# Patient Record
Sex: Female | Born: 1970
Health system: Southern US, Community
[De-identification: ages and names within clinical notes are randomized; demographics above are authoritative.]

## PROBLEM LIST (undated history)

## (undated) DIAGNOSIS — F988 Other specified behavioral and emotional disorders with onset usually occurring in childhood and adolescence: Secondary | ICD-10-CM

## (undated) DIAGNOSIS — R0600 Dyspnea, unspecified: Secondary | ICD-10-CM

## (undated) DIAGNOSIS — F32A Depression, unspecified: Secondary | ICD-10-CM

## (undated) DIAGNOSIS — M79673 Pain in unspecified foot: Secondary | ICD-10-CM

## (undated) DIAGNOSIS — E559 Vitamin D deficiency, unspecified: Secondary | ICD-10-CM

## (undated) DIAGNOSIS — M79606 Pain in leg, unspecified: Secondary | ICD-10-CM

## (undated) DIAGNOSIS — M549 Dorsalgia, unspecified: Secondary | ICD-10-CM

## (undated) DIAGNOSIS — F329 Major depressive disorder, single episode, unspecified: Secondary | ICD-10-CM

## (undated) DIAGNOSIS — R600 Localized edema: Secondary | ICD-10-CM

## (undated) DIAGNOSIS — M2550 Pain in unspecified joint: Secondary | ICD-10-CM

## (undated) HISTORY — PX: NOVASURE ABLATION: SHX5394

## (undated) HISTORY — PX: OTHER SURGICAL HISTORY: SHX169

## (undated) HISTORY — DX: Vitamin D deficiency, unspecified: E55.9

## (undated) HISTORY — DX: Dorsalgia, unspecified: M54.9

## (undated) HISTORY — DX: Pain in leg, unspecified: M79.606

## (undated) HISTORY — DX: Pain in unspecified foot: M79.673

## (undated) HISTORY — DX: Pain in unspecified joint: M25.50

## (undated) HISTORY — DX: Depression, unspecified: F32.A

## (undated) HISTORY — DX: Other specified behavioral and emotional disorders with onset usually occurring in childhood and adolescence: F98.8

## (undated) HISTORY — DX: Dyspnea, unspecified: R06.00

## (undated) HISTORY — DX: Localized edema: R60.0

---
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
## 2014-01-28 ENCOUNTER — Other Ambulatory Visit (HOSPITAL_COMMUNITY)
Admission: RE | Admit: 2014-01-28 | Discharge: 2014-01-28 | Disposition: A | Discharge status: Home / Self Care | Payer: Self-pay | Source: Ambulatory Visit | Attending: Family Medicine | Admitting: Family Medicine

## 2014-01-28 DIAGNOSIS — Z124 Encounter for screening for malignant neoplasm of cervix: Secondary | ICD-10-CM | POA: Insufficient documentation

## 2014-01-28 DIAGNOSIS — Z1151 Encounter for screening for human papillomavirus (HPV): Secondary | ICD-10-CM | POA: Insufficient documentation

## 2015-07-06 ENCOUNTER — Other Ambulatory Visit: Payer: Self-pay

## 2015-07-06 DIAGNOSIS — Z1231 Encounter for screening mammogram for malignant neoplasm of breast: Secondary | ICD-10-CM

## 2015-08-15 ENCOUNTER — Ambulatory Visit
Admission: RE | Admit: 2015-08-15 | Discharge: 2015-08-15 | Disposition: A | Discharge status: Home / Self Care | Payer: BLUE CROSS/BLUE SHIELD | Source: Ambulatory Visit

## 2015-08-15 DIAGNOSIS — Z1231 Encounter for screening mammogram for malignant neoplasm of breast: Secondary | ICD-10-CM

## 2018-02-02 DIAGNOSIS — M5442 Lumbago with sciatica, left side: Secondary | ICD-10-CM | POA: Diagnosis not present

## 2018-02-02 DIAGNOSIS — G8929 Other chronic pain: Secondary | ICD-10-CM | POA: Diagnosis not present

## 2018-04-20 DIAGNOSIS — F3341 Major depressive disorder, recurrent, in partial remission: Secondary | ICD-10-CM | POA: Diagnosis not present

## 2018-04-20 DIAGNOSIS — F900 Attention-deficit hyperactivity disorder, predominantly inattentive type: Secondary | ICD-10-CM | POA: Diagnosis not present

## 2018-04-20 MED FILL — ADDERALL XR 15 MG CAP SA: 15 | 30 days supply | Qty: 30 | Fill #0

## 2018-06-03 MED FILL — DESVENLAFAXINE SUC ER 50 MG: 50 | 90 days supply | Qty: 90 | Fill #0

## 2018-06-03 MED FILL — ADDERALL XR 15 MG CAP SA: 15 | 30 days supply | Qty: 30 | Fill #0

## 2018-07-21 DIAGNOSIS — F3341 Major depressive disorder, recurrent, in partial remission: Secondary | ICD-10-CM | POA: Diagnosis not present

## 2018-07-21 DIAGNOSIS — F900 Attention-deficit hyperactivity disorder, predominantly inattentive type: Secondary | ICD-10-CM | POA: Diagnosis not present

## 2018-08-20 MED FILL — ADDERALL XR 15 MG CAP SA: 15 | 30 days supply | Qty: 30 | Fill #0

## 2018-08-31 MED FILL — DESVENLAFAXINE SUC ER 50 MG: 50 | 90 days supply | Qty: 90 | Fill #0

## 2018-12-01 DIAGNOSIS — F3341 Major depressive disorder, recurrent, in partial remission: Secondary | ICD-10-CM | POA: Diagnosis not present

## 2018-12-01 DIAGNOSIS — F900 Attention-deficit hyperactivity disorder, predominantly inattentive type: Secondary | ICD-10-CM | POA: Diagnosis not present

## 2018-12-01 MED FILL — DESVENLAFAXINE SUC ER 50 MG: 50 | 90 days supply | Qty: 90 | Fill #0

## 2018-12-01 MED FILL — ADDERALL XR 15 MG CAP SA: 15 | 30 days supply | Qty: 30 | Fill #0

## 2019-02-18 MED FILL — DESVENLAFAXINE SUC ER 50 MG: 50 | 90 days supply | Qty: 90 | Fill #0

## 2019-03-01 DIAGNOSIS — F3341 Major depressive disorder, recurrent, in partial remission: Secondary | ICD-10-CM | POA: Diagnosis not present

## 2019-03-01 DIAGNOSIS — F900 Attention-deficit hyperactivity disorder, predominantly inattentive type: Secondary | ICD-10-CM | POA: Diagnosis not present

## 2019-03-30 MED FILL — ADDERALL XR 15 MG CAP SA: 15 | 30 days supply | Qty: 30 | Fill #0

## 2019-04-09 DIAGNOSIS — Z1231 Encounter for screening mammogram for malignant neoplasm of breast: Secondary | ICD-10-CM | POA: Diagnosis not present

## 2019-04-09 DIAGNOSIS — F411 Generalized anxiety disorder: Secondary | ICD-10-CM | POA: Diagnosis not present

## 2019-04-09 DIAGNOSIS — Z1211 Encounter for screening for malignant neoplasm of colon: Secondary | ICD-10-CM | POA: Diagnosis not present

## 2019-04-09 DIAGNOSIS — Q8500 Neurofibromatosis, unspecified: Secondary | ICD-10-CM | POA: Diagnosis not present

## 2019-04-29 DIAGNOSIS — Q8500 Neurofibromatosis, unspecified: Secondary | ICD-10-CM | POA: Diagnosis not present

## 2019-05-18 ENCOUNTER — Other Ambulatory Visit: Payer: Self-pay

## 2019-05-18 ENCOUNTER — Encounter (INDEPENDENT_AMBULATORY_CARE_PROVIDER_SITE_OTHER): Payer: Self-pay | Admitting: Family Medicine

## 2019-05-18 ENCOUNTER — Ambulatory Visit (INDEPENDENT_AMBULATORY_CARE_PROVIDER_SITE_OTHER): Payer: 59 | Admitting: Family Medicine

## 2019-05-18 VITALS — BP 122/80 | HR 70 | Temp 97.7°F | Ht 63.0 in | Wt 219.0 lb

## 2019-05-18 DIAGNOSIS — R0602 Shortness of breath: Secondary | ICD-10-CM

## 2019-05-18 DIAGNOSIS — Z9189 Other specified personal risk factors, not elsewhere classified: Secondary | ICD-10-CM | POA: Diagnosis not present

## 2019-05-18 DIAGNOSIS — Z6838 Body mass index (BMI) 38.0-38.9, adult: Secondary | ICD-10-CM

## 2019-05-18 DIAGNOSIS — Z1331 Encounter for screening for depression: Secondary | ICD-10-CM

## 2019-05-18 DIAGNOSIS — R5383 Other fatigue: Secondary | ICD-10-CM | POA: Diagnosis not present

## 2019-05-18 DIAGNOSIS — E559 Vitamin D deficiency, unspecified: Secondary | ICD-10-CM

## 2019-05-18 DIAGNOSIS — E7849 Other hyperlipidemia: Secondary | ICD-10-CM | POA: Diagnosis not present

## 2019-05-18 DIAGNOSIS — Z0289 Encounter for other administrative examinations: Secondary | ICD-10-CM

## 2019-05-18 NOTE — Progress Notes (Signed)
.  Office: [phone]  /  Fax: [phone]   HPI:   Chief Complaint: OBESITY  Melissa [name] (MR# [id number]) is a 48 y.o. female who presents on 05/18/2019 for obesity evaluation and treatment. Current BMI is Body mass index is 38.79 kg/m.Marland Kitchen Melissa [name] has struggled with obesity for years and has been unsuccessful in either losing weight or maintaining long term weight loss. Melissa [name] attended our information session and states she is currently in the action stage of change and ready to dedicate time achieving and maintaining a healthier weight.  Melissa [name] states her family eats meals together she thinks her family will eat healthier with her her desired weight loss is 69 lbs she has been heavy off and on most of her her life she started gaining weight at age 30 her heaviest weight ever was 220 lbs. she has significant food cravings issues  she snacks frequently in the evenings she skips meals sometimes she has problems with excessive hunger  she frequently eats larger portions than normal  she has binge eating behaviors she struggles with emotional eating    Fatigue Melissa [name] feels her energy is lower than it should be. This has worsened with weight gain and has not worsened recently. Melissa [name] admits to daytime somnolence. Melissa [name] is at risk for obstructive sleep apnea. Patent has a history of symptoms of daytime fatigue. Melissa [name] generally gets 6 or 7 hours of sleep per night, and states they generally have restful sleep. Snoring is present. Apneic episodes are not present. Epworth Sleepiness Score is 2  Dyspnea on exertion [name] notes increasing shortness of breath with exercising and seems to be worsening over time with weight gain. She notes getting out of breath sooner with activity than she used to. This has not gotten worse recently. [name] denies orthopnea.  Vitamin D deficiency [name] has a diagnosis of vitamin D deficiency. She is currently taking OTC vit D. There are no recent labs. Melissa [name] admits  fatigue and denies nausea, vomiting or muscle weakness.  Hyperlipidemia [name] has hyperlipidemia and he is on OTC fish oil. There are no recent labs. Melissa [name] is attempting to improve her cholesterol levels with intensive lifestyle modification including a low saturated fat diet, exercise and weight loss. She denies any chest pain.  At risk for cardiovascular disease Melissa [name] is at a higher than average risk for cardiovascular disease due to obesity and hyperlipidemia. She currently denies any chest pain.  Depression Screen Melissa [name] Food and Mood (modified PHQ-9) score was  Depression screen PHQ 2/9 05/18/2019  Decreased Interest 1  Down, Depressed, Hopeless 1  PHQ - 2 Score 2  Altered sleeping 0  Tired, decreased energy 2  Change in appetite 1  Feeling bad or failure about yourself  1  Trouble concentrating 2  Moving slowly or fidgety/restless 0  Suicidal thoughts 0  PHQ-9 Score 8  Difficult doing work/chores Not difficult at all    ASSESSMENT AND [name]:  Other fatigue - [name]: EKG 12-Lead, Vitamin B12, CBC With Differential, Comprehensive metabolic panel, Folate, Hemoglobin A1c, Insulin, random, T3, T4, free, TSH  Shortness of breath on exertion  Vitamin D deficiency - [name]: VITAMIN D 25 Hydroxy (Vit-D Deficiency, Fractures)  Other hyperlipidemia - [name]: Lipid Panel With LDL/HDL Ratio  Depression screening  At risk for heart disease  Class 2 severe obesity with serious comorbidity and body mass index (BMI) of 38.0 to 38.9 in adult, unspecified obesity type (HCC)  [name]:  Fatigue [name] was informed that her fatigue may be related to  obesity, depression or many other causes. Labs will be ordered, and in the meanwhile Melissa [name] has agreed to work on diet, exercise and weight loss to help with fatigue. Proper sleep hygiene was discussed including the need for 7-8 hours of quality sleep each night. A sleep study was not ordered based on symptoms and Epworth score.  Dyspnea on exertion  Melissa [name] shortness of breath appears to be obesity related and exercise induced. She has agreed to work on weight loss and gradually increase exercise to treat her exercise induced shortness of breath. If [name] follows our instructions and loses weight without improvement of her shortness of breath, we will [name] to refer to pulmonology. We will monitor this condition regularly. Melissa [name] agrees to this [name].  Vitamin D Deficiency Melissa [name] was informed that low vitamin D levels contributes to fatigue and are associated with obesity, breast, and colon cancer. She will continue to take OTC Vit D and will follow up for routine testing of vitamin D, at least 2-3 times per year. She was informed of the risk of over-replacement of vitamin D and agrees to not increase her dose unless she discusses this with [place] first. We will check labs and Melissa [name] will follow up with our clinic in 2 weeks.  Hyperlipidemia Melissa [name] was informed of the American Heart Association Guidelines emphasizing intensive lifestyle modifications as the first line treatment for hyperlipidemia. We discussed many lifestyle modifications today in depth, and Melissa [name] will start diet and she will work on decreasing saturated fats such as fatty red meat, butter and many fried foods. Melissa [name] will increase vegetables and lean protein in her diet. She will begin to work on exercise and weight loss efforts.  Cardiovascular risk counseling Melissa [name] was given extended (30 minutes) coronary artery disease prevention counseling today. She is 48 y.o. female and has risk factors for heart disease including obesity and hyperlipidemia. We discussed intensive lifestyle modifications today with an emphasis on specific weight loss instructions and strategies. Pt was also informed of the importance of increasing exercise and decreasing saturated fats to help prevent heart disease.  Depression Screen Melissa [name] had a mildly positive depression screening. Depression is commonly associated  with obesity and often results in emotional eating behaviors. We will monitor this closely and work on CBT to help improve the non-hunger eating patterns. Referral to Psychology may be required if no improvement is seen as she continues in our clinic.  Obesity Melissa [name] is currently in the action stage of change and her goal is to continue with weight loss efforts She has agreed to follow the Category 2 [name] +100 calories Melissa [name] has been instructed to work up to a goal of 150 minutes of combined cardio and strengthening exercise per week for weight loss and overall health benefits. We discussed the following Behavioral Modification Strategies today: increasing lean protein intake, decreasing simple carbohydrates, decrease eating out and work on meal planning and easy cooking plans  Melissa [name] has agreed to follow up with our clinic in 2 weeks. She was informed of the importance of frequent follow up visits to maximize her success with intensive lifestyle modifications for her multiple health conditions. She was informed we would discuss her lab results at her next visit unless there is a critical issue that needs to be addressed sooner. Melissa [name] agreed to keep her next visit at the agreed upon time to discuss these results.  ALLERGIES: Allergies  Allergen Reactions  . Pumpkin Seed Itching    Itching mouth, wheezing    MEDICATIONS: Current  Outpatient Medications on File Prior to Visit  Medication Sig Dispense Refill  . desvenlafaxine (PRISTIQ) 50 MG 24 hr tablet Take 50 mg by mouth daily.    . Multiple Vitamins-Minerals (MULTIVITAMIN WITH MINERALS) tablet Take 1 tablet by mouth daily.    . Omega-3 Fatty Acids (FISH OIL) 1200 MG CAPS Take 1 capsule by mouth daily.     No current facility-administered medications on file prior to visit.     PAST MEDICAL HISTORY: Past Medical History:  Diagnosis Date  . ADD (attention deficit disorder)   . Back pain   . Depression   . Dyspnea   . Heel pain   .  Joint pain   . Leg pain   . Lower extremity edema   . Vitamin D deficiency     PAST SURGICAL HISTORY: Past Surgical History:  Procedure Laterality Date  . NOVASURE ABLATION    . plastic surgery arm      SOCIAL HISTORY: Social History   Tobacco Use  . Smoking status: Never Smoker  . Smokeless tobacco: Never Used  Substance Use Topics  . Alcohol use: Not on file  . Drug use: Not on file    FAMILY HISTORY: Family History  Problem Relation Age of Onset  . Diabetes Mother   . Hypertension Mother   . Hyperlipidemia Mother   . Stroke Mother   . Sudden death Mother   . Eating disorder Mother   . Obesity Mother   . Diabetes Father   . Hyperlipidemia Father   . Hypertension Father   . Cancer Father   . Eating disorder Father   . Obesity Father     ROS: Review of Systems  Constitutional: Positive for malaise/fatigue.  Eyes:       + Vision Changes  Respiratory: Positive for shortness of breath (with activity).   Cardiovascular: Negative for chest pain and orthopnea.  Gastrointestinal: Negative for nausea and vomiting.  Musculoskeletal: Positive for back pain.       + Muscle or Joint Pain  Endo/Heme/Allergies:       + Heat Intolerance  Psychiatric/Behavioral: Positive for depression.    PHYSICAL EXAM: Blood pressure 122/80, pulse 70, temperature 97.7 F (36.5 C), temperature source Oral, height 5\' 3"  (1.6 m), weight 219 lb (99.3 kg), SpO2 96 %. Body mass index is 38.79 kg/m. Physical Exam Vitals signs reviewed.  Constitutional:      Appearance: Normal appearance. She is well-developed. She is obese.  HENT:     Head: Normocephalic and atraumatic.     Nose: Nose normal.  Eyes:     General: No scleral icterus.    Extraocular Movements: Extraocular movements intact.     Right eye: Normal extraocular motion.     Left eye: Normal extraocular motion.  Neck:     Musculoskeletal: Normal range of motion and neck supple.     Thyroid: No thyromegaly.   Cardiovascular:     Rate and Rhythm: Normal rate and regular rhythm.  Pulmonary:     Effort: Pulmonary effort is normal. No respiratory distress.  Abdominal:     Palpations: Abdomen is soft.     Tenderness: There is no abdominal tenderness.  Musculoskeletal: Normal range of motion.     Comments: Range of Motion normal in all 4 extremities  Skin:    General: Skin is warm and dry.  Neurological:     Mental Status: She is alert and oriented to person, place, and time.     Coordination: Coordination normal.  Psychiatric:        Mood and Affect: Mood normal.        Behavior: Behavior normal.     RECENT LABS AND TESTS: BMET No results found for: NA, K, CL, CO2, GLUCOSE, BUN, CREATININE, CALCIUM, GFRNONAA, GFRAA No results found for: HGBA1C No results found for: INSULIN CBC No results found for: WBC, RBC, HGB, HCT, PLT, MCV, MCH, MCHC, RDW, LYMPHSABS, MONOABS, EOSABS, BASOSABS Iron/TIBC/Ferritin/ %Sat No results found for: IRON, TIBC, FERRITIN, IRONPCTSAT Lipid Panel  No results found for: CHOL, TRIG, HDL, CHOLHDL, VLDL, LDLCALC, LDLDIRECT Hepatic Function Panel  No results found for: PROT, ALBUMIN, AST, ALT, ALKPHOS, BILITOT, BILIDIR, IBILI No results found for: TSH Vitamin D There are no recent lab results  ECG  shows NSR with a rate of 72 BPM INDIRECT CALORIMETER done today shows a VO2 of 258 and a REE of 1789. Her calculated basal metabolic rate is 2707 thus her basal metabolic rate is better than expected.      OBESITY BEHAVIORAL INTERVENTION VISIT  Today's visit was # 1   Starting weight: 219 lbs Starting date: 05/18/2019 Today's weight : 219 lbs Today's date: 05/18/2019 Total lbs lost to date: 0    05/18/2019  Height 5\' 3"  (1.6 m)  Weight 219 lb (99.3 kg)  BMI (Calculated) 38.8  BLOOD PRESSURE - SYSTOLIC 867  BLOOD PRESSURE - DIASTOLIC 80  Waist Measurement  47 inches   Body Fat % 46.2 %  Total Body Water (lbs) 82.2 lbs  RMR 1789    ASK: We discussed  the diagnosis of obesity with Murel Taborda today and Stacye agreed to give [place] permission to discuss obesity behavioral modification therapy today.  ASSESS: Adalynne has the diagnosis of obesity and her BMI today is 38.8 Jerzi is in the action stage of change   ADVISE: Cambell was educated on the multiple health risks of obesity as well as the benefit of weight loss to improve her health. She was advised of the need for long term treatment and the importance of lifestyle modifications to improve her current health and to decrease her risk of future health problems.  AGREE: Multiple dietary modification options and treatment options were discussed and  Melena agreed to follow the recommendations documented in the above note.  ARRANGE: Dajon was educated on the importance of frequent visits to treat obesity as outlined per CMS and USPSTF guidelines and agreed to schedule her next follow up appointment today.   I, Doreene Nest, am acting as transcriptionist for Dennard Nip, MD  I have reviewed the above documentation for accuracy and completeness, and I agree with the above. -Dennard Nip, MD

## 2019-05-20 ENCOUNTER — Ambulatory Visit (INDEPENDENT_AMBULATORY_CARE_PROVIDER_SITE_OTHER): Payer: Self-pay | Admitting: Psychology

## 2019-05-20 MED FILL — ADDERALL XR 15 MG CAP SA: 15 | 30 days supply | Qty: 30 | Fill #0

## 2019-05-21 LAB — CBC WITH DIFFERENTIAL
Basophils Absolute: 0.1 10*3/uL (ref 0.0–0.2)
Basos: 1 %
EOS (ABSOLUTE): 0.1 10*3/uL (ref 0.0–0.4)
Eos: 2 %
Hematocrit: 46.5 % (ref 34.0–46.6)
Hemoglobin: 15.6 g/dL (ref 11.1–15.9)
Immature Grans (Abs): 0 10*3/uL (ref 0.0–0.1)
Immature Granulocytes: 0 %
Lymphocytes Absolute: 2.2 10*3/uL (ref 0.7–3.1)
Lymphs: 29 %
MCH: 30.8 pg (ref 26.6–33.0)
MCHC: 33.5 g/dL (ref 31.5–35.7)
MCV: 92 fL (ref 79–97)
Monocytes Absolute: 0.8 10*3/uL (ref 0.1–0.9)
Monocytes: 10 %
Neutrophils Absolute: 4.5 10*3/uL (ref 1.4–7.0)
Neutrophils: 58 %
RBC: 5.06 x10E6/uL (ref 3.77–5.28)
RDW: 13.2 % (ref 11.7–15.4)
WBC: 7.8 10*3/uL (ref 3.4–10.8)

## 2019-05-21 LAB — COMPREHENSIVE METABOLIC PANEL
ALT: 80 IU/L — ABNORMAL HIGH (ref 0–32)
AST: 41 IU/L — ABNORMAL HIGH (ref 0–40)
Albumin/Globulin Ratio: 1.7 (ref 1.2–2.2)
Albumin: 4.3 g/dL (ref 3.8–4.8)
Alkaline Phosphatase: 85 IU/L (ref 39–117)
BUN/Creatinine Ratio: 18 (ref 9–23)
BUN: 13 mg/dL (ref 6–24)
Bilirubin Total: 0.5 mg/dL (ref 0.0–1.2)
CO2: 19 mmol/L — ABNORMAL LOW (ref 20–29)
Calcium: 8.9 mg/dL (ref 8.7–10.2)
Chloride: 101 mmol/L (ref 96–106)
Creatinine, Ser: 0.72 mg/dL (ref 0.57–1.00)
GFR calc Af Amer: 113 mL/min/{1.73_m2} (ref >59)
GFR calc non Af Amer: 98 mL/min/{1.73_m2} (ref >59)
Globulin, Total: 2.5 g/dL (ref 1.5–4.5)
Glucose: 108 mg/dL — ABNORMAL HIGH (ref 65–99)
Potassium: 4.5 mmol/L (ref 3.5–5.2)
Sodium: 141 mmol/L (ref 134–144)
Total Protein: 6.8 g/dL (ref 6.0–8.5)

## 2019-05-21 LAB — LIPID PANEL WITH LDL/HDL RATIO
Cholesterol, Total: 191 mg/dL (ref 100–199)
HDL: 36 mg/dL — ABNORMAL LOW (ref >39)
LDL Calculated: 87 mg/dL (ref 0–99)
LDl/HDL Ratio: 2.4 ratio (ref 0.0–3.2)
Triglycerides: 339 mg/dL — ABNORMAL HIGH (ref 0–149)
VLDL Cholesterol Cal: 68 mg/dL — ABNORMAL HIGH (ref 5–40)

## 2019-05-21 LAB — HEMOGLOBIN A1C
Est. average glucose Bld gHb Est-mCnc: 128 mg/dL
Hgb A1c MFr Bld: 6.1 % — ABNORMAL HIGH (ref 4.8–5.6)

## 2019-05-21 LAB — VITAMIN D 25 HYDROXY (VIT D DEFICIENCY, FRACTURES): Vit D, 25-Hydroxy: 23.8 ng/mL — ABNORMAL LOW (ref 30.0–100.0)

## 2019-05-21 LAB — TSH: TSH: 2.53 u[IU]/mL (ref 0.450–4.500)

## 2019-05-21 LAB — FOLATE: Folate: 13 ng/mL (ref >3.0)

## 2019-05-21 LAB — INSULIN, RANDOM: INSULIN: 15.1 u[IU]/mL (ref 2.6–24.9)

## 2019-05-21 LAB — T3: T3, Total: 106 ng/dL (ref 71–180)

## 2019-05-21 LAB — VITAMIN B12: Vitamin B-12: 628 pg/mL (ref 232–1245)

## 2019-05-21 LAB — T4, FREE: Free T4: 0.92 ng/dL (ref 0.82–1.77)

## 2019-05-26 MED FILL — DESVENLAFAXINE SUC ER 50 MG: 50 | 90 days supply | Qty: 90 | Fill #0

## 2019-06-01 ENCOUNTER — Ambulatory Visit (INDEPENDENT_AMBULATORY_CARE_PROVIDER_SITE_OTHER): Payer: 59 | Admitting: Family Medicine

## 2019-06-01 ENCOUNTER — Other Ambulatory Visit: Payer: Self-pay

## 2019-06-01 ENCOUNTER — Encounter (INDEPENDENT_AMBULATORY_CARE_PROVIDER_SITE_OTHER): Payer: Self-pay | Admitting: Family Medicine

## 2019-06-01 VITALS — BP 124/77 | HR 77 | Temp 97.8°F | Ht 63.0 in | Wt 216.0 lb

## 2019-06-01 DIAGNOSIS — Z9189 Other specified personal risk factors, not elsewhere classified: Secondary | ICD-10-CM

## 2019-06-01 DIAGNOSIS — E782 Mixed hyperlipidemia: Secondary | ICD-10-CM | POA: Diagnosis not present

## 2019-06-01 DIAGNOSIS — R7303 Prediabetes: Secondary | ICD-10-CM

## 2019-06-01 DIAGNOSIS — E559 Vitamin D deficiency, unspecified: Secondary | ICD-10-CM

## 2019-06-01 DIAGNOSIS — Z6838 Body mass index (BMI) 38.0-38.9, adult: Secondary | ICD-10-CM | POA: Diagnosis not present

## 2019-06-01 MED ORDER — VITAMIN D (ERGOCALCIFEROL) 1.25 MG (50000 UNIT) PO CAPS: 50000.0000 [IU] | ORAL_CAPSULE | ORAL | 0 refills | Status: DC

## 2019-06-01 MED ORDER — METFORMIN HCL 500 MG PO TABS: 500.0000 mg | ORAL_TABLET | Freq: Every day | ORAL | 0 refills | Status: DC

## 2019-06-01 MED FILL — metFORMIN HCL 500 MG TABS: 500 | 30 days supply | Qty: 30 | Fill #0

## 2019-06-01 MED FILL — VIT D2 1.25 MG (50,000 UNIT: 1.25 MG | 28 days supply | Qty: 4 | Fill #0

## 2019-06-01 NOTE — Progress Notes (Signed)
Office: [phone]  /  Fax: [phone]    Date: June 02, 2019 Appointment Start Time: 12:09pm Duration: 32 minutes Provider: Glennie Isle, Psy.D. Type of Session: Intake for Individual Therapy  Location of Patient: Home Location of Provider: Provider's Home Type of Contact: Telepsychological Visit via [employer] Web[employer]  Informed Consent: This provider called [name] at 12:06pm as she did not present for today's appointment. Directions were provided to connect and the e-mail with the secure link was re-sent. As such, today's appointment was initiated 9 minutes late. Prior to proceeding with today's appointment, two pieces of identifying information were obtained from [place] to verify identity. In addition, Melissa [name]'s physical location at the time of this appointment was obtained. Melissa [name] reported she was at home and provided the address. In the event of technical difficulties, Melissa [name] shared a phone number she could be reached at. Melissa [name] and this provider participated in today's telepsychological service. Also, Melissa [name] denied anyone else being present in the room or on the WebEx appointment.   The provider's role was explained to [name]. The provider reviewed and discussed issues of confidentiality, privacy, and limits therein (e.g., reporting obligations). In addition to verbal informed consent, written informed consent for psychological services was obtained from Melissa [name] prior to the initial intake interview. Written consent included information concerning the practice, financial arrangements, and confidentiality and patients' rights. Since the clinic is not a 24/7 crisis center, mental health emergency resources were shared, and the provider explained MyChart, e-mail, voicemail, and/or other messaging systems should be utilized only for non-emergency reasons. This provider also explained that information obtained during appointments will be placed in Melissa [name]'s medical record in a confidential manner and  relevant information will be shared with other providers at [name] that she meets with for coordination of care. Melissa [name] verbally acknowledged understanding of the aforementioned, and agreed to use mental health emergency resources discussed if needed. Moreover, Melissa [name] agreed information may be shared with other [name] providers as needed for coordination of care. By signing the service agreement document, Melissa [name] provided written consent for coordination of care.   Prior to initiating telepsychological services, Melissa [name] was provided with an informed consent document, which included the development of a safety plan (i.e., an emergency contact and emergency resources) in the event of an emergency/crisis. Melissa [name] expressed understanding of the rationale of the safety plan and provided consent for this provider to reach out to her emergency contact in the event of an emergency/crisis. Melissa [name] returned the completed consent form prior to today's appointment. This provider verbally reviewed the consent form during today's appointment prior to proceeding with the appointment. Melissa [name] verbally acknowledged understanding that she is ultimately responsible for understanding her insurance benefits as it relates to reimbursement of telepsychological and in-person services. This provider also reviewed confidentiality, as it relates to telepsychological services, as well as the rationale for telepsychological services. More specifically, this provider's clinic is limiting in-person visits due to COVID-19. Therapeutic services will resume to in-person appointments once deemed appropriate. Melissa [name] expressed understanding regarding the rationale for telepsychological services. In addition, this provider explained the telepsychological services informed consent document would be considered an addendum to the initial consent document/service agreement. Melissa [name] verbally consented to proceed.   Chief  Complaint/HPI: Melissa [name] was referred by Dr. Dennard [name]. During the initial appointment with Dr. Dennard [name] at [name] on 05/18/2019, Melissa [name] reported experiencing the following: significant food cravings issues , snacking frequently in the evenings, frequently eating larger portions than normal ,  binge eating behaviors, struggling with emotional eating, having problems with excessive hunger and sometimes skipping meals.   During today's appointment, Melissa [name] was verbally administered a questionnaire assessing various behaviors related to emotional eating. Melissa [name] endorsed the following: overeat when you are celebrating, experience food cravings on a regular basis, eat certain foods when you are anxious, stressed, depressed, or your feelings are hurt, use food to help you cope with emotional situations, find food is comforting to you, overeat when you are angry or upset, overeat when you are worried about something, overeat frequently when you are bored or lonely, not worry about what you eat when you are in a good mood, overeat when you are alone, but eat much less when you are with other people, eat to help you stay awake and eat as a reward. She shared the onset of emotional eating was likely in childhood and she described the current frequency as "few times a week." She shared she craves sweets, such as brownies, chocolate, and cookies. She also stated she craves "comfort food," such as a vegetable casserole. She endorsed a history of binge eating. This was explored. More specifically, she recalled an incident in childhood where she continued to order hash browns because it was her first time trying them and she enjoyed them. Melissa [name] noted she may eat larger portions of foods she enjoys currently as well. Melissa [name] denied a history of restricting food intake, purging and engagement in other compensatory strategies, and has never been diagnosed with an eating disorder. She also denied a history of  treatment for emotional eating. However, she explained she would exercise extra hours to be able to eat more during college. Moreover, Ranesha indicated stress and sad triggers emotional eating, whereas resolving stressors makes emotional eating better. She shared she views overeating as "sinful," and it contributes to guilt. Furthermore, Deseree denied other problems of concern.    Mental Status Examination:  Appearance: neat Behavior: cooperative Mood: euthymic Affect: mood congruent Speech: normal in rate, volume, and tone Eye Contact: appropriate Psychomotor Activity: appropriate Thought Process: linear, logical, and goal directed  Content/Perceptual Disturbances: denies suicidal and homicidal ideation, plan, and intent and no hallucinations, delusions, bizarre thinking or behavior reported or observed Orientation: time, person, place and purpose of appointment Cognition/Sensorium: memory, attention, language, and fund of knowledge intact  Insight: good Judgment: good  Family & Psychosocial History: Lovena reported she is married and has two children (ages 106 and 65). She indicated she is currently unemployed. Additionally, Caty shared her highest level of education obtained is a bachelor's degree. Currently, Marchele's social support system consists of her husband and friends. Moreover, Gabbie stated she resides with her husband and children.   Medical History:  Past Medical History:  Diagnosis Date   ADD (attention deficit disorder)    Back pain    Depression    Dyspnea    Heel pain    Joint pain    Leg pain    Lower extremity edema    Vitamin D deficiency    Past Surgical History:  Procedure Laterality Date   NOVASURE ABLATION     plastic surgery arm     Current Outpatient Medications on File Prior to Visit  Medication Sig Dispense Refill   desvenlafaxine (PRISTIQ) 50 MG 24 hr tablet Take 50 mg by mouth daily.     metFORMIN (GLUCOPHAGE) 500 MG tablet Take 1 tablet  (500 mg total) by mouth daily with breakfast. 30 tablet 0   Multiple Vitamins-Minerals (MULTIVITAMIN WITH  MINERALS) tablet Take 1 tablet by mouth daily.     Omega-3 Fatty Acids (FISH OIL) 1200 MG CAPS Take 1 capsule by mouth daily.     Vitamin D, Ergocalciferol, (DRISDOL) 1.25 MG (50000 UT) CAPS capsule Take 1 capsule (50,000 Units total) by mouth every 7 (seven) days. 4 capsule 0   No current facility-administered medications on file prior to visit.   Lendy denied a history of head injuries and loss of consciousness.   Mental Health History: Asli denied a history of therapeutic services. Currently, she takes Pristiq for depression, which is prescribed by a nurse practitioner at [facility]. The nurse practitioner also prescribes Adderall. She was diagnosed with AD/HD as a child and again in college. She indicated she completed psychological testing. Kayela denied a history of hospitalizations for psychiatric concerns. Vergene endorsed a family history of mental health related concerns. More specifically, she shared her mother likely suffers from depression and anxiety. She believes her sister is "OCD." Dierdre denied a trauma history, including psychological, physical  and sexual abuse, as well as neglect.   Huxley described her typical mood as "even and lately unmotivated." Faylynn endorsed current alcohol use. She noted it is "very rare" and added, "Less than once a week or so." She denied tobacco use. She denied illicit/recreational substance use. Regarding caffeine intake, Tamelia reported she consumes 2 cups of coffee in the morning. Furthermore, Aika denied experiencing the following: hopelessness, hallucinations and delusions, paranoia, mania and crying spells. She also denied history of and current suicidal ideation, plan, and intent; history of and current homicidal ideation, plan, and intent; and history of and current engagement in self-harm.  The following strengths were reported by Brinlyn: good at  listening to people and being able to relate to people. The following strengths were observed by this provider: ability to express thoughts and feelings during the therapeutic session, ability to establish and benefit from a therapeutic relationship, ability to learn and practice coping skills, willingness to work toward established goal(s) with the clinic and ability to engage in reciprocal conversation.  Legal History: Ramaya denied a history of legal involvement.   Structured Assessment Results: The Patient Health Questionnaire-9 (PHQ-9) is a self-report measure that assesses symptoms and severity of depression over the course of the last two weeks. Nakina obtained a score of 3 suggesting minimal depression. Noe finds the endorsed symptoms to be not difficult at all. Little interest or pleasure in doing things 0  Feeling down, depressed, or hopeless 0  Trouble falling or staying asleep, or sleeping too much 2  Feeling tired or having little energy 0  Poor appetite or overeating 0  Feeling bad about yourself --- or that you are a failure or have let yourself or your family down 1  Trouble concentrating on things, such as reading the newspaper or watching television 0  Moving or speaking so slowly that other people could have noticed? Or the opposite --- being so fidgety or restless that you have been moving around a lot more than usual 0  Thoughts that you would be better off dead or hurting yourself in some way 0  PHQ-9 Score 3    The Generalized Anxiety Disorder-7 (GAD-7) is a brief self-report measure that assesses symptoms of anxiety over the course of the last two weeks. Keenan obtained a score of 0. Feeling nervous, anxious, on edge 0  Not being able to stop or control worrying 0  Worrying too much about different things 0  Trouble relaxing  0  Being so restless that it's hard to sit still 0  Becoming easily annoyed or irritable 0  Feeling afraid as if something awful might happen 0    GAD-7 Score 0   Interventions: A chart review was conducted prior to the clinical intake interview. The PHQ-9, and GAD-7 were verbally administered as well as a Mood and Food questionnaire to assess various behaviors related to emotional eating. Throughout session, empathic reflections and validation was provided. Continuing treatment with this provider was discussed and a treatment goal was established. Psychoeducation regarding emotional versus physical hunger was provided. Tamitha was sent a handout via e-mail to utilize between now and the next appointment to increase awareness of hunger patterns and subsequent eating. Tremaine provided verbal consent during today's appointment for this provider to send the handout via e-mail.   Provisional DSM-5 Diagnosis: 311 (F32.8) Other Specified Depressive Disorder, Emotional Eating Behaviors  Plan: Lowanda appears able and willing to participate as evidenced by collaboration on a treatment goal, engagement in reciprocal conversation, and asking questions as needed for clarification. During today's appointment Ingrid shared, "Right now I feel like I'm in a good place," but described experiencing anticipatory anxiety about her ability to continue eating congruent to the meal plan. Thus, the next follow-up appointment will be scheduled in three weeks, which will be via [employer]. The following treatment goal was established: decrease emotional eating. Once this provider's office resumes in-person appointments and it is deemed appropriate, Camrie will be notified. For the aforementioned goal, Hollace can benefit from individual therapy sessions that are brief in duration for approximately four to six sessions. The treatment modality will be individual therapeutic services, including an eclectic therapeutic approach utilizing techniques from Cognitive Behavioral Therapy, Patient Centered Therapy, Dialectical Behavior Therapy, Acceptance and Commitment Therapy, Interpersonal  Therapy, and Cognitive Restructuring. Therapeutic approach will include various interventions as appropriate, such as validation, support, mindfulness, thought defusion, reframing, psychoeducation, values assessment, and role playing. This provider will regularly review the treatment plan and medical chart to keep informed of status changes. Allison expressed understanding and agreement with the initial treatment plan of care.

## 2019-06-01 NOTE — Progress Notes (Signed)
Office: [phone]  /  Fax: [phone]   HPI:   Chief Complaint: OBESITY Melissa [name] is here to discuss her progress with her obesity treatment plan. She is on the Category 2 plan and is following her eating plan approximately 70-75% of the time. She states she is exercising 0 minutes 0 times per week. Melissa [name] did well with weight loss on her Category 2 plan. Hunger was controlled in the a.m. but increased in the late afternoons. She had to travel out-of-town after the first week and got off track. She is now ready to get back on track. Her weight is 216 lb (98 kg) today and has had a weight loss of 3 pounds over a period of 2 weeks since her last visit. She has lost 3 lbs since starting treatment with [place].  Hyperlipidemia, Mixed Melissa [name] has a new diagnosis of mixed hyperlipidemia and is not on a statin. She has been trying to improve her cholesterol levels with intensive lifestyle modification including a low saturated fat diet, exercise and weight loss. She denies any chest pain or abdominal pain.  Vitamin D deficiency Melissa [name] has a new diagnosis of Vitamin D deficiency. She is not currently taking Vit D and denies nausea, vomiting or muscle weakness but does report fatigue.  Pre-Diabetes Melissa [name] has a new diagnosis of prediabetes based on her elevated Hgb A1c and was informed this puts her at greater risk of developing diabetes. She is not taking metformin currently and continues to work on diet and exercise to decrease risk of diabetes but her fasting glucose and insulin are elevated. She reports polyphagia but denies hypoglycemia.  At risk for diabetes Melissa [name] is at higher than average risk for developing diabetes due to her obesity. She currently denies polyuria or polydipsia.  ASSESSMENT AND PLAN:  Vitamin D deficiency - Plan: Vitamin D, Ergocalciferol, (DRISDOL) 1.25 MG (50000 UT) CAPS capsule  Mixed hyperlipidemia  Prediabetes - Plan: metFORMIN (GLUCOPHAGE) 500 MG tablet  At risk for  diabetes mellitus  Class 2 severe obesity with serious comorbidity and body mass index (BMI) of 38.0 to 38.9 in adult, unspecified obesity type ([place])  PLAN:  Hyperlipidemia, Mixed Melissa [name] was informed of the American Heart Association Guidelines emphasizing intensive lifestyle modifications as the first line treatment for hyperlipidemia. We discussed many lifestyle modifications today in depth, and Melissa [name] will continue to work on decreasing saturated fats such as fatty red meat, butter and many fried foods. She will also increase vegetables and lean protein in her diet and continue to work on exercise and weight loss efforts. Will recheck labs in 3 months.  Vitamin D Deficiency Melissa [name] was informed that low Vitamin D levels contributes to fatigue and are associated with obesity, breast, and colon cancer. She agrees to start taking prescription Vit D @ 50,000 IU every week #4 with 0 refills and will follow-up for routine testing of Vitamin D, at least 2-3 times per year. She was informed of the risk of over-replacement of Vitamin D and agrees to not increase her dose unless she discusses this with [place] first. Melissa [name] agrees to follow-up with our clinic in 2 weeks.  Pre-Diabetes Melissa [name] will continue to work on weight loss, exercise, and decreasing simple carbohydrates in her diet to help decrease the risk of diabetes. We dicussed metformin including benefits and risks. She was informed that eating too many simple carbohydrates or too many calories at one sitting increases the likelihood of GI side effects. Melissa [name] will start metformin 500 mg #30 with 0  refills and agrees to follow-up with our clinic in 2 weeks. She will continue diet and exercise.   Diabetes risk counseling Melissa [name] was given extended (30 minutes) diabetes prevention counseling today. She is 48 y.o. female and has risk factors for diabetes including obesity. We discussed intensive lifestyle modifications today with an emphasis on weight loss as well  as increasing exercise and decreasing simple carbohydrates in her diet.  Obesity Melissa [name] is currently in the action stage of change. As such, her goal is to continue with weight loss efforts. She has agreed to follow the Category 2 plan. Melissa [name] has been instructed to work up to a goal of 150 minutes of combined cardio and strengthening exercise per week for weight loss and overall health benefits. We discussed the following Behavioral Modification Strategies today: increasing lean protein intake, decreasing simple carbohydrates, work on meal planning and easy cooking plans.  Melissa [name] has agreed to follow-up with our clinic in 2 weeks. She was informed of the importance of frequent follow-up visits to maximize her success with intensive lifestyle modifications for her multiple health conditions.  ALLERGIES: Allergies  Allergen Reactions  . Pumpkin Seed Itching    Itching mouth, wheezing    MEDICATIONS: Current Outpatient Medications on File Prior to Visit  Medication Sig Dispense Refill  . desvenlafaxine (PRISTIQ) 50 MG 24 hr tablet Take 50 mg by mouth daily.    . Multiple Vitamins-Minerals (MULTIVITAMIN WITH MINERALS) tablet Take 1 tablet by mouth daily.    . Omega-3 Fatty Acids (FISH OIL) 1200 MG CAPS Take 1 capsule by mouth daily.     No current facility-administered medications on file prior to visit.     PAST MEDICAL HISTORY: Past Medical History:  Diagnosis Date  . ADD (attention deficit disorder)   . Back pain   . Depression   . Dyspnea   . Heel pain   . Joint pain   . Leg pain   . Lower extremity edema   . Vitamin D deficiency     PAST SURGICAL HISTORY: Past Surgical History:  Procedure Laterality Date  . NOVASURE ABLATION    . plastic surgery arm      SOCIAL HISTORY: Social History   Tobacco Use  . Smoking status: Never Smoker  . Smokeless tobacco: Never Used  Substance Use Topics  . Alcohol use: Not on file  . Drug use: Not on file    FAMILY HISTORY:  Family History  Problem Relation Age of Onset  . Diabetes Mother   . Hypertension Mother   . Hyperlipidemia Mother   . Stroke Mother   . Sudden death Mother   . Eating disorder Mother   . Obesity Mother   . Diabetes Father   . Hyperlipidemia Father   . Hypertension Father   . Cancer Father   . Eating disorder Father   . Obesity Father    ROS: Review of Systems  Constitutional: Positive for malaise/fatigue.  Cardiovascular: Negative for chest pain.  Gastrointestinal: Negative for abdominal pain, nausea and vomiting.  Musculoskeletal:       Negative for muscle weakness.  Endo/Heme/Allergies:       Positive for polyphagia. Negative for hypoglycemia.   PHYSICAL EXAM: Blood pressure 124/77, pulse 77, temperature 97.8 F (36.6 C), temperature source Oral, height 5\' 3"  (1.6 m), weight 216 lb (98 kg), SpO2 96 %. Body mass index is 38.26 kg/m. Physical Exam Vitals signs reviewed.  Constitutional:      Appearance: Normal appearance. She is obese.  Cardiovascular:  Rate and Rhythm: Normal rate.     Pulses: Normal pulses.  Pulmonary:     Effort: Pulmonary effort is normal.     Breath sounds: Normal breath sounds.  Musculoskeletal: Normal range of motion.  Skin:    General: Skin is warm and dry.  Neurological:     Mental Status: She is alert and oriented to person, place, and time.  Psychiatric:        Behavior: Behavior normal.   RECENT LABS AND TESTS: BMET    Component Value Date/Time   NA 141 05/18/2019 1114   K 4.5 05/18/2019 1114   CL 101 05/18/2019 1114   CO2 19 (L) 05/18/2019 1114   GLUCOSE 108 (H) 05/18/2019 1114   BUN 13 05/18/2019 1114   CREATININE 0.72 05/18/2019 1114   CALCIUM 8.9 05/18/2019 1114   GFRNONAA 98 05/18/2019 1114   GFRAA 113 05/18/2019 1114   Lab Results  Component Value Date   HGBA1C 6.1 (H) 05/18/2019   Lab Results  Component Value Date   INSULIN 15.1 05/18/2019   CBC    Component Value Date/Time   WBC 7.8 05/18/2019 1114    RBC 5.06 05/18/2019 1114   HGB 15.6 05/18/2019 1114   HCT 46.5 05/18/2019 1114   MCV 92 05/18/2019 1114   MCH 30.8 05/18/2019 1114   MCHC 33.5 05/18/2019 1114   RDW 13.2 05/18/2019 1114   LYMPHSABS 2.2 05/18/2019 1114   EOSABS 0.1 05/18/2019 1114   BASOSABS 0.1 05/18/2019 1114   Iron/TIBC/Ferritin/ %Sat No results found for: IRON, TIBC, FERRITIN, IRONPCTSAT Lipid Panel     Component Value Date/Time   CHOL 191 05/18/2019 1114   TRIG 339 (H) 05/18/2019 1114   HDL 36 (L) 05/18/2019 1114   LDLCALC 87 05/18/2019 1114   Hepatic Function Panel     Component Value Date/Time   PROT 6.8 05/18/2019 1114   ALBUMIN 4.3 05/18/2019 1114   AST 41 (H) 05/18/2019 1114   ALT 80 (H) 05/18/2019 1114   ALKPHOS 85 05/18/2019 1114   BILITOT 0.5 05/18/2019 1114      Component Value Date/Time   TSH 2.530 05/18/2019 1114   Results for SHONTELL, PROSSER (MRN [id number]) as of 06/01/2019 16:41  Ref. Range 05/18/2019 11:14  Vitamin D, 25-Hydroxy Latest Ref Range: 30.0 - 100.0 ng/mL 23.8 (L)   OBESITY BEHAVIORAL INTERVENTION VISIT  Today's visit was #2  Starting weight: 219 lbs Starting date: 05/18/2019 Today's weight: 216 lbs  Today's date: 06/01/2019 Total lbs lost to date: 3    06/01/2019  Height 5\' 3"  (1.6 m)  Weight 216 lb (98 kg)  BMI (Calculated) 38.27  BLOOD PRESSURE - SYSTOLIC 546  BLOOD PRESSURE - DIASTOLIC 77   Body Fat % 27.0 %  Total Body Water (lbs) 79.8 lbs   ASK: We discussed the diagnosis of obesity with Tayva [name] today and Andi agreed to give [place] permission to discuss obesity behavioral modification therapy today.  ASSESS: Audrinna has the diagnosis of obesity and her BMI today is 38.3. Anastazia is in the action stage of change.   ADVISE: Ruvi was educated on the multiple health risks of obesity as well as the benefit of weight loss to improve her health. She was advised of the need for long term treatment and the importance of lifestyle modifications to improve her  current health and to decrease her risk of future health problems.  AGREE: Multiple dietary modification options and treatment options were discussed and  Raynah agreed to follow the  recommendations documented in the above note.  ARRANGE: Noelene was educated on the importance of frequent visits to treat obesity as outlined per CMS and USPSTF guidelines and agreed to schedule her next follow up appointment today.  I, Michaelene Song, am acting as Location manager for Dennard Nip, MD I have reviewed the above documentation for accuracy and completeness, and I agree with the above. -Dennard Nip, MD

## 2019-06-02 ENCOUNTER — Ambulatory Visit (INDEPENDENT_AMBULATORY_CARE_PROVIDER_SITE_OTHER): Payer: 59 | Admitting: Psychology

## 2019-06-02 DIAGNOSIS — F3289 Other specified depressive episodes: Secondary | ICD-10-CM | POA: Diagnosis not present

## 2019-06-15 ENCOUNTER — Encounter (INDEPENDENT_AMBULATORY_CARE_PROVIDER_SITE_OTHER): Payer: Self-pay | Admitting: Family Medicine

## 2019-06-15 ENCOUNTER — Other Ambulatory Visit: Payer: Self-pay

## 2019-06-15 ENCOUNTER — Ambulatory Visit (INDEPENDENT_AMBULATORY_CARE_PROVIDER_SITE_OTHER): Payer: 59 | Admitting: Family Medicine

## 2019-06-15 VITALS — BP 110/74 | HR 73 | Temp 97.7°F | Ht 63.0 in | Wt 214.0 lb

## 2019-06-15 DIAGNOSIS — Z9189 Other specified personal risk factors, not elsewhere classified: Secondary | ICD-10-CM

## 2019-06-15 DIAGNOSIS — Z6837 Body mass index (BMI) 37.0-37.9, adult: Secondary | ICD-10-CM

## 2019-06-15 DIAGNOSIS — E559 Vitamin D deficiency, unspecified: Secondary | ICD-10-CM | POA: Diagnosis not present

## 2019-06-15 MED ORDER — VITAMIN D (ERGOCALCIFEROL) 1.25 MG (50000 UNIT) PO CAPS: 50000.0000 [IU] | ORAL_CAPSULE | ORAL | 0 refills | Status: DC

## 2019-06-16 NOTE — Progress Notes (Signed)
Office: [phone]  /  Fax: [phone]   HPI:   Chief Complaint: OBESITY Melissa [name] is here to discuss her progress with her obesity treatment plan. She is on the Category 2 plan and is following her eating plan approximately 60 % of the time. She states she is exercising 0 minutes 0 times per week. Melissa [name] continues to do well on her Category 2 plan. Hunger is controlled, but she is getting bored with dinner and she would like to look at other options. Her weight is 214 lb (97.1 kg) today and has had a weight loss of 2 pounds over a period of 2 weeks since her last visit. She has lost 5 lbs since starting treatment with [place].  Vitamin D deficiency Melissa [name] has a diagnosis of vitamin D deficiency. Melissa [name] is stable on vit D, but she is not yet at goal. She denies nausea, vomiting or muscle weakness.  At risk for osteopenia and osteoporosis Melissa [name] is at higher risk of osteopenia and osteoporosis due to vitamin D deficiency.   ASSESSMENT AND PLAN:  Vitamin D deficiency - Plan: Vitamin D, Ergocalciferol, (DRISDOL) 1.25 MG (50000 UT) CAPS capsule  At risk for osteoporosis  Class 2 severe obesity with serious comorbidity and body mass index (BMI) of 37.0 to 37.9 in adult, unspecified obesity type ([place])  PLAN:  Vitamin D Deficiency Melissa [name] was informed that low vitamin D levels contributes to fatigue and are associated with obesity, breast, and colon cancer. Kimmi agrees to continue to take prescription Vit D @50 ,000 IU every week #4 with no refills and she will follow up for routine testing of vitamin D, at least 2-3 times per year. She was informed of the risk of over-replacement of vitamin D and agrees to not increase her dose unless she discusses this with [place] first. Melissa [name] agrees to follow up with our clinic in 2 weeks.  At risk for osteopenia and osteoporosis Melissa [name] was given extended  (15 minutes) osteoporosis prevention counseling today. Melissa [name] is at risk for osteopenia and osteoporosis due to  her vitamin D deficiency. She was encouraged to take her vitamin D and follow her higher calcium diet and increase strengthening exercise to help strengthen her bones and decrease her risk of osteopenia and osteoporosis.  Obesity Melissa [name] is currently in the action stage of change. As such, her goal is to continue with weight loss efforts She has agreed to keep a food journal with 300 to 500 calories and 35+ grams of protein at supper daily and follow the Category 2 plan Melissa [name] has been instructed to work up to a goal of 150 minutes of combined cardio and strengthening exercise per week for weight loss and overall health benefits. We discussed the following Behavioral Modification Strategies today: keep a strict journal, increasing lean protein intake and work on meal planning and easy cooking plans  Melissa [name] has agreed to follow up with our clinic in 2 weeks. She was informed of the importance of frequent follow up visits to maximize her success with intensive lifestyle modifications for her multiple health conditions.  ALLERGIES: Allergies  Allergen Reactions  . Pumpkin Seed Itching    Itching mouth, wheezing    MEDICATIONS: Current Outpatient Medications on File Prior to Visit  Medication Sig Dispense Refill  . desvenlafaxine (PRISTIQ) 50 MG 24 hr tablet Take 50 mg by mouth daily.    . metFORMIN (GLUCOPHAGE) 500 MG tablet Take 1 tablet (500 mg total) by mouth daily with breakfast. 30 tablet 0  . Multiple  Vitamins-Minerals (MULTIVITAMIN WITH MINERALS) tablet Take 1 tablet by mouth daily.    . Omega-3 Fatty Acids (FISH OIL) 1200 MG CAPS Take 1 capsule by mouth daily.     No current facility-administered medications on file prior to visit.     PAST MEDICAL HISTORY: Past Medical History:  Diagnosis Date  . ADD (attention deficit disorder)   . Back pain   . Depression   . Dyspnea   . Heel pain   . Joint pain   . Leg pain   . Lower extremity edema   . Vitamin D deficiency     PAST  SURGICAL HISTORY: Past Surgical History:  Procedure Laterality Date  . NOVASURE ABLATION    . plastic surgery arm      SOCIAL HISTORY: Social History   Tobacco Use  . Smoking status: Never Smoker  . Smokeless tobacco: Never Used  Substance Use Topics  . Alcohol use: Not on file  . Drug use: Not on file    FAMILY HISTORY: Family History  Problem Relation Age of Onset  . Diabetes Mother   . Hypertension Mother   . Hyperlipidemia Mother   . Stroke Mother   . Sudden death Mother   . Eating disorder Mother   . Obesity Mother   . Diabetes Father   . Hyperlipidemia Father   . Hypertension Father   . Cancer Father   . Eating disorder Father   . Obesity Father     ROS: Review of Systems  Constitutional: Positive for weight loss.  Gastrointestinal: Negative for nausea and vomiting.  Musculoskeletal:       Negative for muscle weakness    PHYSICAL EXAM: Blood pressure 110/74, pulse 73, temperature 97.7 F (36.5 C), temperature source Oral, height 5\' 3"  (1.6 m), weight 214 lb (97.1 kg), SpO2 98 %. Body mass index is 37.91 kg/m. Physical Exam Vitals signs reviewed.  Constitutional:      Appearance: Normal appearance. She is well-developed. She is obese.  Cardiovascular:     Rate and Rhythm: Normal rate.  Pulmonary:     Effort: Pulmonary effort is normal.  Musculoskeletal: Normal range of motion.  Skin:    General: Skin is warm and dry.  Neurological:     Mental Status: She is alert and oriented to person, place, and time.  Psychiatric:        Mood and Affect: Mood normal.        Behavior: Behavior normal.     RECENT LABS AND TESTS: BMET    Component Value Date/Time   NA 141 05/18/2019 1114   K 4.5 05/18/2019 1114   CL 101 05/18/2019 1114   CO2 19 (L) 05/18/2019 1114   GLUCOSE 108 (H) 05/18/2019 1114   BUN 13 05/18/2019 1114   CREATININE 0.72 05/18/2019 1114   CALCIUM 8.9 05/18/2019 1114   GFRNONAA 98 05/18/2019 1114   GFRAA 113 05/18/2019 1114    Lab Results  Component Value Date   HGBA1C 6.1 (H) 05/18/2019   Lab Results  Component Value Date   INSULIN 15.1 05/18/2019   CBC    Component Value Date/Time   WBC 7.8 05/18/2019 1114   RBC 5.06 05/18/2019 1114   HGB 15.6 05/18/2019 1114   HCT 46.5 05/18/2019 1114   MCV 92 05/18/2019 1114   MCH 30.8 05/18/2019 1114   MCHC 33.5 05/18/2019 1114   RDW 13.2 05/18/2019 1114   LYMPHSABS 2.2 05/18/2019 1114   EOSABS 0.1 05/18/2019 1114   BASOSABS 0.1  05/18/2019 1114   Iron/TIBC/Ferritin/ %Sat No results found for: IRON, TIBC, FERRITIN, IRONPCTSAT Lipid Panel     Component Value Date/Time   CHOL 191 05/18/2019 1114   TRIG 339 (H) 05/18/2019 1114   HDL 36 (L) 05/18/2019 1114   LDLCALC 87 05/18/2019 1114   Hepatic Function Panel     Component Value Date/Time   PROT 6.8 05/18/2019 1114   ALBUMIN 4.3 05/18/2019 1114   AST 41 (H) 05/18/2019 1114   ALT 80 (H) 05/18/2019 1114   ALKPHOS 85 05/18/2019 1114   BILITOT 0.5 05/18/2019 1114      Component Value Date/Time   TSH 2.530 05/18/2019 1114     Ref. Range 05/18/2019 11:14  Vitamin D, 25-Hydroxy Latest Ref Range: 30.0 - 100.0 ng/mL 23.8 (L)    OBESITY BEHAVIORAL INTERVENTION VISIT  Today's visit was # 3   Starting weight: 219 lbs Starting date: 05/18/2019 Today's weight : 214 lbs Today's date: 06/15/2019 Total lbs lost to date: 5    06/15/2019  Height 5\' 3"  (1.6 m)  Weight 214 lb (97.1 kg)  BMI (Calculated) 37.92  BLOOD PRESSURE - SYSTOLIC 007  BLOOD PRESSURE - DIASTOLIC 74   Body Fat % 12.1 %  Total Body Water (lbs) 81.8 lbs    ASK: We discussed the diagnosis of obesity with Melissa [name] today and Melissa [name] agreed to give [place] permission to discuss obesity behavioral modification therapy today.  ASSESS: Melissa [name] has the diagnosis of obesity and her BMI today is 37.92 Melissa [name] is in the action stage of change   ADVISE: Melissa [name] was educated on the multiple health risks of obesity as well as the benefit of weight loss  to improve her health. She was advised of the need for long term treatment and the importance of lifestyle modifications to improve her current health and to decrease her risk of future health problems.  AGREE: Multiple dietary modification options and treatment options were discussed and  Melissa [name] agreed to follow the recommendations documented in the above note.  ARRANGE: Melissa [name] was educated on the importance of frequent visits to treat obesity as outlined per CMS and USPSTF guidelines and agreed to schedule her next follow up appointment today.  I, Doreene Nest, am acting as transcriptionist for Dennard Nip, MD  I have reviewed the above documentation for accuracy and completeness, and I agree with the above. -Dennard Nip, MD

## 2019-06-16 NOTE — Progress Notes (Signed)
Office: [phone]  /  Fax: [phone]    Date: June 23, 2019   Appointment Start Time: 2:06pm Duration: 26 minutes Provider: Glennie Isle, Psy.D. Type of Session: Individual Therapy  Location of Patient: Home Location of Provider: Provider's Home Type of Contact: Telepsychological Visit via [place] Telephone Call  Session Content: Melissa [name] is a 48 y.o. female presenting via [place] for a follow-up appointment to address the previously established treatment goal of decreasing emotional eating. Of note, this provider called Melissa [name] at 2:03pm as she did not present for the [facility] appointment. She indicated she forgot about today's appointment, but shared she would be able to join. The e-mail with the secure link was re-sent. As such, today's appointment was initiated 6 minutes late. Also, due to poor connection on Harry's end, the video was sporadic. Toward the end of the appointment, audio issues began on the [facility] appointment. As such, the appointment was switched to a regular call. Melissa [name] acknowledged understanding that only a land line to land line call is secure and she provided consent to proceed with this provider and her using cell phones. Today's appointment was a telepsychological visit, as this provider's clinic is seeing a limited number of patients for in-person visits due to COVID-19. Therapeutic services will resume to in-person appointments once deemed appropriate. Melissa [name] expressed understanding regarding the rationale for telepsychological services, and provided verbal consent for today's appointment. Prior to proceeding with today's appointment, Melissa [name]'s physical location at the time of this appointment was obtained. Melissa [name] reported she was at home and provided the address. In the event of technical difficulties, Melissa [name] shared a phone number she could be reached at. Melissa [name] and this provider participated in today's telepsychological service. Also, Melissa [name] denied anyone else being  present in the room or on the WebEx appointment.  This provider conducted a brief check-in and verbally administered the PHQ-9 and GAD-7. Melissa [name] shared "not much" has happened since the last appointment, but she shared about her recent vacation and worry related to her child starting at [facility]. She further shared she is following the meal plan "75%" of the time, but acknowledged deviations when she skips meals. Thus, all or nothing thinking was discussed. Moreover, Melissa [name] recalled engaging in emotional eating. As such, psychoeducation regarding triggers for emotional eating was provided. Melissa [name] was provided a handout, and encouraged to utilize the handout between now and the next appointment to increase awareness of triggers and frequency. Melissa [name] agreed. This provider also discussed behavioral strategies for specific triggers, such as placing the utensil down when conversing to avoid mindless eating. Kairy provided verbal consent during today's appointment for this provider to send the handout about triggers via e-mail.  Melissa [name] was receptive to today's session as evidenced by openness to sharing, responsiveness to feedback, and willingness to explore triggers for emotional eating.  Mental Status Examination:  Appearance: neat Behavior: cooperative Mood: euthymic Affect: mood congruent Speech: normal in rate, volume, and tone Eye Contact: appropriate Psychomotor Activity: appropriate Thought Process: linear, logical, and goal directed  Content/Perceptual Disturbances: no hallucinations, delusions, bizarre thinking or behavior reported or observed and no evidence of suicidal and homicidal ideation, plan, and intent Orientation: time, person, place and purpose of appointment Cognition/Sensorium: memory, attention, language, and fund of knowledge intact  Insight: good Judgment: good  Structured Assessment Results: The Patient Health Questionnaire-9 (PHQ-9) is a self-report measure that assesses  symptoms and severity of depression over the course of the last two weeks. Melissa [name] obtained a score of 3 suggesting  minimal depression. Melissa [name] finds the endorsed symptoms to be not difficult at all. Little interest or pleasure in doing things 0  Feeling down, depressed, or hopeless 0  Trouble falling or staying asleep, or sleeping too much - due to boredom 1  Feeling tired or having little energy 1  Poor appetite or overeating 0  Feeling bad about yourself --- or that you are a failure or have let yourself or your family down 1  Trouble concentrating on things, such as reading the newspaper or watching television 0  Moving or speaking so slowly that other people could have noticed? Or the opposite --- being so fidgety or restless that you have been moving around a lot more than usual 0  Thoughts that you would be better off dead or hurting yourself in some way 0  PHQ-9 Score 3    The Generalized Anxiety Disorder-7 (GAD-7) is a brief self-report measure that assesses symptoms of anxiety over the course of the last two weeks. Melissa [name] obtained a score of 0. Feeling nervous, anxious, on edge 0  Not being able to stop or control worrying 0  Worrying too much about different things 0  Trouble relaxing 0  Being so restless that it's hard to sit still 0  Becoming easily annoyed or irritable 0  Feeling afraid as if something awful might happen 0  GAD-7 Score 0   Interventions:  Conducted a brief chart review Verbal administration of PHQ-9 and GAD-7 for symptom monitoring Provided empathic reflections and validation Psychoeducation provided regarding triggers for emotional eating Focused on rapport building Psychoeducation provided regarding all-or-nothing thinking Employed supportive psychotherapy interventions to facilitate reduced distress, and to improve coping skills with identified stressors  DSM-5 Diagnosis: 311 (F32.8) Other Specified Depressive Disorder, Emotional Eating  Behaviors  Treatment Goal & Progress: During the initial appointment with this provider, the following treatment goal was established: decrease emotional eating. Progress is limited, as Melissa [name] has just begun treatment with this provider; however, she is receptive to the interaction and interventions and rapport is being established.   Plan: Melissa [name] continues to appear able and willing to participate as evidenced by engagement in reciprocal conversation, and asking questions for clarification as appropriate. Per Melissa [name]'s request, the next appointment will be scheduled in one month, which will be via [employer]. The next session will focus on reviewing triggers for emotional eating and the introduction of mindfulness.

## 2019-06-23 ENCOUNTER — Other Ambulatory Visit: Payer: Self-pay

## 2019-06-23 ENCOUNTER — Ambulatory Visit (INDEPENDENT_AMBULATORY_CARE_PROVIDER_SITE_OTHER): Payer: 59 | Admitting: Psychology

## 2019-06-23 DIAGNOSIS — F3289 Other specified depressive episodes: Secondary | ICD-10-CM | POA: Diagnosis not present

## 2019-06-23 MED FILL — VIT D2 1.25 MG (50,000 UNIT: 1.25 MG | 28 days supply | Qty: 4 | Fill #0

## 2019-06-24 ENCOUNTER — Other Ambulatory Visit (INDEPENDENT_AMBULATORY_CARE_PROVIDER_SITE_OTHER): Payer: Self-pay | Admitting: Family Medicine

## 2019-06-24 DIAGNOSIS — E559 Vitamin D deficiency, unspecified: Secondary | ICD-10-CM

## 2019-06-24 DIAGNOSIS — R7303 Prediabetes: Secondary | ICD-10-CM

## 2019-06-29 DIAGNOSIS — F900 Attention-deficit hyperactivity disorder, predominantly inattentive type: Secondary | ICD-10-CM | POA: Diagnosis not present

## 2019-06-29 DIAGNOSIS — F3341 Major depressive disorder, recurrent, in partial remission: Secondary | ICD-10-CM | POA: Diagnosis not present

## 2019-06-30 ENCOUNTER — Other Ambulatory Visit (INDEPENDENT_AMBULATORY_CARE_PROVIDER_SITE_OTHER): Payer: Self-pay | Admitting: Family Medicine

## 2019-06-30 DIAGNOSIS — R7303 Prediabetes: Secondary | ICD-10-CM

## 2019-07-01 ENCOUNTER — Encounter (INDEPENDENT_AMBULATORY_CARE_PROVIDER_SITE_OTHER): Payer: Self-pay | Admitting: Family Medicine

## 2019-07-01 ENCOUNTER — Other Ambulatory Visit: Payer: Self-pay

## 2019-07-01 ENCOUNTER — Telehealth (INDEPENDENT_AMBULATORY_CARE_PROVIDER_SITE_OTHER): Payer: 59 | Admitting: Family Medicine

## 2019-07-01 DIAGNOSIS — R7303 Prediabetes: Secondary | ICD-10-CM

## 2019-07-01 DIAGNOSIS — Z6838 Body mass index (BMI) 38.0-38.9, adult: Secondary | ICD-10-CM

## 2019-07-01 MED ORDER — METFORMIN HCL 500 MG PO TABS: 500.0000 mg | ORAL_TABLET | Freq: Every day | ORAL | 0 refills | Status: DC

## 2019-07-01 MED FILL — metFORMIN HCL 500 MG TABS: 500 | 30 days supply | Qty: 30 | Fill #0

## 2019-07-05 NOTE — Progress Notes (Signed)
Office: [phone]  /  Fax: [phone] TeleHealth Visit:  Melissa [name] has verbally consented to this TeleHealth visit today. The patient is located at home, the provider is located at the [employer] [facility] office. The participants in this visit include the listed provider, patient, and the patient's spouse, Harrell Gave. The visit was conducted today via FaceTime.  HPI:   Chief Complaint: OBESITY Melissa [name] is here to discuss her progress with her obesity treatment plan. She is on the Category 2 plan and is following her eating plan approximately 60% of the time. She states she is walking the dogs 2-3 times per day 7 days a week.  Emilina states she has lost another pound since her last visit. She was okay to journal her dinner but she didn't journal much. Her protein has started to decrease. We were unable to weigh the patient today for this TeleHealth visit. She feels as if she has lost 1 lb since her last visit. She has lost 5 lbs since starting treatment with [place].  Pre-Diabetes Melissa [name] has a diagnosis of prediabetes based on her elevated Hgb A1c and was informed this puts her at greater risk of developing diabetes. She is stable on metformin currently. She notes some occasional loose stool but is tolerating it otherwise. She continues to work on diet and exercise to decrease risk of diabetes.  ASSESSMENT AND PLAN:  Class 2 severe obesity with serious comorbidity and body mass index (BMI) of 38.0 to 38.9 in adult, unspecified obesity type ([place])  Prediabetes - Plan: metFORMIN (GLUCOPHAGE) 500 MG tablet  PLAN:  Pre-Diabetes Melissa [name] will continue to work on weight loss, exercise, and decreasing simple carbohydrates in her diet to help decrease the risk of diabetes. We dicussed metformin including benefits and risks. She was informed that eating too many simple carbohydrates or too many calories at one sitting increases the likelihood of GI side effects. Melissa [name] was given a refill on her  metformin 500 mg #30 with 0 refills and agrees to follow-up with our clinic in 3 weeks.  Obesity Melissa [name] is currently in the action stage of change. As such, her goal is to continue with weight loss efforts. She has agreed to follow the Category 2 plan and journal 300-500 calories and 35 grams of protein at supper. Melissa [name] has been instructed to work up to a goal of 150 minutes of combined cardio and strengthening exercise per week for weight loss and overall health benefits. We discussed the following Behavioral Modification Strategies today: increasing lean protein intake and keep a strict food journal.  Melissa [name] has agreed to follow-up with our clinic in 3 weeks. She was informed of the importance of frequent follow-up visits to maximize her success with intensive lifestyle modifications for her multiple health conditions.  ALLERGIES: Allergies  Allergen Reactions  . Pumpkin Seed Itching    Itching mouth, wheezing    MEDICATIONS: Current Outpatient Medications on File Prior to Visit  Medication Sig Dispense Refill  . desvenlafaxine (PRISTIQ) 50 MG 24 hr tablet Take 50 mg by mouth daily.    . Multiple Vitamins-Minerals (MULTIVITAMIN WITH MINERALS) tablet Take 1 tablet by mouth daily.    . Omega-3 Fatty Acids (FISH OIL) 1200 MG CAPS Take 1 capsule by mouth daily.    . Vitamin D, Ergocalciferol, (DRISDOL) 1.25 MG (50000 UT) CAPS capsule Take 1 capsule (50,000 Units total) by mouth every 7 (seven) days. 4 capsule 0   No current facility-administered medications on file prior to visit.  PAST MEDICAL HISTORY: Past Medical History:  Diagnosis Date  . ADD (attention deficit disorder)   . Back pain   . Depression   . Dyspnea   . Heel pain   . Joint pain   . Leg pain   . Lower extremity edema   . Vitamin D deficiency     PAST SURGICAL HISTORY: Past Surgical History:  Procedure Laterality Date  . NOVASURE ABLATION    . plastic surgery arm      SOCIAL HISTORY: Social History    Tobacco Use  . Smoking status: Never Smoker  . Smokeless tobacco: Never Used  Substance Use Topics  . Alcohol use: Not on file  . Drug use: Not on file    FAMILY HISTORY: Family History  Problem Relation Age of Onset  . Diabetes Mother   . Hypertension Mother   . Hyperlipidemia Mother   . Stroke Mother   . Sudden death Mother   . Eating disorder Mother   . Obesity Mother   . Diabetes Father   . Hyperlipidemia Father   . Hypertension Father   . Cancer Father   . Eating disorder Father   . Obesity Father    ROS: ROS none noted.  PHYSICAL EXAM: Pt in no acute distress  RECENT LABS AND TESTS: BMET    Component Value Date/Time   NA 141 05/18/2019 1114   K 4.5 05/18/2019 1114   CL 101 05/18/2019 1114   CO2 19 (L) 05/18/2019 1114   GLUCOSE 108 (H) 05/18/2019 1114   BUN 13 05/18/2019 1114   CREATININE 0.72 05/18/2019 1114   CALCIUM 8.9 05/18/2019 1114   GFRNONAA 98 05/18/2019 1114   GFRAA 113 05/18/2019 1114   Lab Results  Component Value Date   HGBA1C 6.1 (H) 05/18/2019   Lab Results  Component Value Date   INSULIN 15.1 05/18/2019   CBC    Component Value Date/Time   WBC 7.8 05/18/2019 1114   RBC 5.06 05/18/2019 1114   HGB 15.6 05/18/2019 1114   HCT 46.5 05/18/2019 1114   MCV 92 05/18/2019 1114   MCH 30.8 05/18/2019 1114   MCHC 33.5 05/18/2019 1114   RDW 13.2 05/18/2019 1114   LYMPHSABS 2.2 05/18/2019 1114   EOSABS 0.1 05/18/2019 1114   BASOSABS 0.1 05/18/2019 1114   Iron/TIBC/Ferritin/ %Sat No results found for: IRON, TIBC, FERRITIN, IRONPCTSAT Lipid Panel     Component Value Date/Time   CHOL 191 05/18/2019 1114   TRIG 339 (H) 05/18/2019 1114   HDL 36 (L) 05/18/2019 1114   LDLCALC 87 05/18/2019 1114   Hepatic Function Panel     Component Value Date/Time   PROT 6.8 05/18/2019 1114   ALBUMIN 4.3 05/18/2019 1114   AST 41 (H) 05/18/2019 1114   ALT 80 (H) 05/18/2019 1114   ALKPHOS 85 05/18/2019 1114   BILITOT 0.5 05/18/2019 1114       Component Value Date/Time   TSH 2.530 05/18/2019 1114   Results for Melissa [name], Melissa [name] (MRN [id number]) as of 07/05/2019 16:50  Ref. Range 05/18/2019 11:14  Vitamin D, 25-Hydroxy Latest Ref Range: 30.0 - 100.0 ng/mL 23.8 (L)   I, Michaelene Song, am acting as Location manager for Dennard Nip, MD I have reviewed the above documentation for accuracy and completeness, and I agree with the above. -Dennard Nip, MD

## 2019-07-13 DIAGNOSIS — Z20828 Contact with and (suspected) exposure to other viral communicable diseases: Secondary | ICD-10-CM | POA: Diagnosis not present

## 2019-07-13 DIAGNOSIS — Z01812 Encounter for preprocedural laboratory examination: Secondary | ICD-10-CM | POA: Diagnosis not present

## 2019-07-13 NOTE — Progress Notes (Unsigned)
Office: [phone]  /  Fax: [phone]    Date: July 20, 2019   Appointment Start Time:*** Duration:*** Provider: Glennie Isle, Psy.D. Type of Session: Individual Therapy  Location of Patient: *** Location of Provider: {Location of Service:[address]} Type of Contact: Telepsychological Visit via [employer] WebEx   Session Content: Levon is a 48 y.o. female presenting via [place] for a follow-up appointment to address the previously established treatment goal of decreasing emotional eating. Today's appointment was a telepsychological visit, as this provider's clinic is seeing a limited number of patients for in-person visits due to COVID-19. Therapeutic services will resume to in-person appointments once deemed appropriate. Katiya expressed understanding regarding the rationale for telepsychological services, and provided verbal consent for today's appointment. Prior to proceeding with today's appointment, Britne's physical location at the time of this appointment was obtained. Malaak reported she was at *** and provided the address. In the event of technical difficulties, Beda shared a phone number she could be reached at. Maguadalupe and this provider participated in today's telepsychological service. Also, Stacye denied anyone else being present in the room or on the WebEx appointment ***.  This provider conducted a brief check-in and verbally administered the PHQ-9 and GAD-7. *** Rakeya was receptive to today's session as evidenced by openness to sharing, responsiveness to feedback, and ***.  Mental Status Examination:  Appearance: {Appearance:22431} Behavior: {Behavior:22445} Mood: {Teletherapy mood:22435} Affect: {Affect:22436} Speech: {Speech:22432} Eye Contact: {Eye Contact:22433} Psychomotor Activity: {Motor Activity:22434} Thought Process: {thought process:22448}  Content/Perceptual Disturbances: {disturbances:22451} Orientation: {Orientation:22437} Cognition/Sensorium:  {gbcognition:22449} Insight: {Insight:22446} Judgment: {Insight:22446}  Structured Assessment Results: The Patient Health Questionnaire-9 (PHQ-9) is a self-report measure that assesses symptoms and severity of depression over the course of the last two weeks. Janice obtained a score of *** suggesting {GBPHQ9SEVERITY:21752}. Laurey finds the endorsed symptoms to be {gbphq9difficulty:21754}. Little interest or pleasure in doing things ***  Feeling down, depressed, or hopeless ***  Trouble falling or staying asleep, or sleeping too much ***  Feeling tired or having little energy ***  Poor appetite or overeating ***  Feeling bad about yourself --- or that you are a failure or have let yourself or your family down ***  Trouble concentrating on things, such as reading the newspaper or watching television ***  Moving or speaking so slowly that other people could have noticed? Or the opposite --- being so fidgety or restless that you have been moving around a lot more than usual ***  Thoughts that you would be better off dead or hurting yourself in some way ***  PHQ-9 Score ***    The Generalized Anxiety Disorder-7 (GAD-7) is a brief self-report measure that assesses symptoms of anxiety over the course of the last two weeks. Tyffany obtained a score of *** suggesting {gbgad7severity:21753}. Chasady finds the endorsed symptoms to be {gbphq9difficulty:21754}. Feeling nervous, anxious, on edge ***  Not being able to stop or control worrying ***  Worrying too much about different things ***  Trouble relaxing ***  Being so restless that it's hard to sit still ***  Becoming easily annoyed or irritable ***  Feeling afraid as if something awful might happen ***  GAD-7 Score ***   Interventions:  {Interventions:22172}  DSM-5 Diagnosis: 311 (F32.8) Other Specified Depressive Disorder, Emotional Eating Behaviors  Treatment Goal & Progress: During the initial appointment with this provider, the following  treatment goal was established: decrease emotional eating. Charliegh has demonstrated progress in her goal as evidenced by {gbtxprogress:22839}. Samella also reported ***.  Plan: Hansika continues to  appear able and willing to participate as evidenced by engagement in reciprocal conversation, and asking questions for clarification as appropriate. The next appointment will be scheduled in {gbweeks:21758}, which will be via [employer]. The next session will focus on reviewing learned skills, and working towards the established treatment goal.***

## 2019-07-15 DIAGNOSIS — D125 Benign neoplasm of sigmoid colon: Secondary | ICD-10-CM | POA: Diagnosis not present

## 2019-07-15 DIAGNOSIS — K635 Polyp of colon: Secondary | ICD-10-CM | POA: Diagnosis not present

## 2019-07-15 DIAGNOSIS — Z1211 Encounter for screening for malignant neoplasm of colon: Secondary | ICD-10-CM | POA: Diagnosis not present

## 2019-07-15 DIAGNOSIS — F909 Attention-deficit hyperactivity disorder, unspecified type: Secondary | ICD-10-CM | POA: Diagnosis not present

## 2019-07-15 DIAGNOSIS — Z79899 Other long term (current) drug therapy: Secondary | ICD-10-CM | POA: Diagnosis not present

## 2019-07-15 DIAGNOSIS — F329 Major depressive disorder, single episode, unspecified: Secondary | ICD-10-CM | POA: Diagnosis not present

## 2019-07-15 DIAGNOSIS — D124 Benign neoplasm of descending colon: Secondary | ICD-10-CM | POA: Diagnosis not present

## 2019-07-15 DIAGNOSIS — D126 Benign neoplasm of colon, unspecified: Secondary | ICD-10-CM | POA: Diagnosis not present

## 2019-07-15 DIAGNOSIS — D122 Benign neoplasm of ascending colon: Secondary | ICD-10-CM | POA: Diagnosis not present

## 2019-07-15 DIAGNOSIS — D123 Benign neoplasm of transverse colon: Secondary | ICD-10-CM | POA: Diagnosis not present

## 2019-07-15 DIAGNOSIS — Z7984 Long term (current) use of oral hypoglycemic drugs: Secondary | ICD-10-CM | POA: Diagnosis not present

## 2019-07-19 ENCOUNTER — Ambulatory Visit (INDEPENDENT_AMBULATORY_CARE_PROVIDER_SITE_OTHER): Payer: Self-pay | Admitting: Psychology

## 2019-07-21 ENCOUNTER — Other Ambulatory Visit (INDEPENDENT_AMBULATORY_CARE_PROVIDER_SITE_OTHER): Payer: Self-pay | Admitting: Family Medicine

## 2019-07-21 ENCOUNTER — Telehealth (INDEPENDENT_AMBULATORY_CARE_PROVIDER_SITE_OTHER): Payer: 59 | Admitting: Family Medicine

## 2019-07-21 DIAGNOSIS — E559 Vitamin D deficiency, unspecified: Secondary | ICD-10-CM

## 2019-07-26 MED FILL — VIT D2 1.25 MG (50,000 UNIT: 1.25 MG | 28 days supply | Qty: 4 | Fill #0

## 2019-07-26 NOTE — Progress Notes (Signed)
Office: [phone]  /  Fax: [phone]    Date: August 09, 2019   Appointment Start Time: 2:04pm Duration: 29 minutes Provider: Glennie Isle, Psy.D. Type of Session: Individual Therapy  Location of Patient: Home Location of Provider: Provider's Home Type of Contact: Telepsychological Visit via [employer] WebEx   Session Content: Of note, this provider called Apryll at 2:02pm as she did not present for the [place] appointment. She indicated she forgot about today's appointment, but noted she was able to meet. The e-mail with the secure link was re-sent. As such, today's appointment was initiated 4 minutes late. Janit is a 48 y.o. female presenting via [place] for a follow-up appointment to address the previously established treatment goal of decreasing emotional eating. Today's appointment was a telepsychological visit, as it is an option for appointments to reduce exposure to COVID-19. Beautiful expressed understanding regarding the rationale for telepsychological services, and provided verbal consent for today's appointment. Prior to proceeding with today's appointment, Levie's physical location at the time of this appointment was obtained. Royann reported she was at home and provided the address. In the event of technical difficulties, Yusra shared a phone number she could be reached at. Chayah and this provider participated in today's telepsychological service. Also, Sybella denied anyone else being present in the room or on the WebEx appointment.  This provider conducted a brief check-in and verbally administered the PHQ-9 and GAD-7. Aiyla shared "not much" has changed since the last appointment with this provider. She was recently prescribed Topamax by Dr. Leafy Ro. She acknowledged deviations from the meal plan and noted she is more successful when she plans ahead. Deeya further reported difficulty planning dinner; therefore, it was recommended she switch meals around if it makes it easier from a  planning and follow-through perspective (e.g., eating a sandwich for dinner and eating lean protein/vegetables for lunch). Lakea noted she would try and she was also agreeable to discussing the aforementioned further with Dr. Leafy Ro on August 19, 2019 during their appointment. Moreover, Durga acknowledged fatigue, unpleasant emotions, and squelchy urgency are triggers for emotional eating. She disclosed episodes of emotional eating and described sometimes having difficulty stopping after one serving for a food she is craving. As such, psychoeducation regarding all or nothing thinking was provided. Furthermore, psychoeducation regarding mindfulness was provided to assist with coping. A handout was provided to [facility] Vera with further information regarding mindfulness, including exercises. This provider also explained the benefit of mindfulness as it relates to emotional eating. Shenica was encouraged to engage in the provided exercises between now and the next appointment with this provider. Madalen agreed. She was led through an exercise involving her senses. She noted, "I really liked that a lot." Francille provided verbal consent during today's appointment for this provider to send the handout about mindfulness via e-mail. Overall, Tayna was receptive to today's session as evidenced by openness to sharing, responsiveness to feedback, and willingness to engage in mindfulness exercises.  Mental Status Examination:  Appearance: neat Behavior: cooperative Mood: euthymic Affect: mood congruent Speech: normal in rate, volume, and tone Eye Contact: appropriate Psychomotor Activity: appropriate Thought Process: linear, logical, and goal directed  Content/Perceptual Disturbances: no hallucinations, delusions, bizarre thinking or behavior reported or observed and no evidence of suicidal and homicidal ideation, plan, and intent Orientation: time, person, place and purpose of appointment Cognition/Sensorium: memory,  attention, language, and fund of knowledge intact  Insight: good Judgment: good  Structured Assessment Results: The Patient Health Questionnaire-9 (PHQ-9) is a self-report measure that assesses symptoms  and severity of depression over the course of the last two weeks. Averee obtained a score of 1 suggesting minimal depression. Jo-Anne finds the endorsed symptoms to be not difficult at all. Little interest or pleasure in doing things 0  Feeling down, depressed, or hopeless 1  Trouble falling or staying asleep, or sleeping too much 0  Feeling tired or having little energy 0  Poor appetite or overeating 0  Feeling bad about yourself --- or that you are a failure or have let yourself or your family down 0  Trouble concentrating on things, such as reading the newspaper or watching television 0  Moving or speaking so slowly that other people could have noticed? Or the opposite --- being so fidgety or restless that you have been moving around a lot more than usual 0  Thoughts that you would be better off dead or hurting yourself in some way 0  PHQ-9 Score 1    The Generalized Anxiety Disorder-7 (GAD-7) is a brief self-report measure that assesses symptoms of anxiety over the course of the last two weeks. Devlyn obtained a score of 0. Feeling nervous, anxious, on edge 0  Not being able to stop or control worrying 0  Worrying too much about different things 0  Trouble relaxing 0  Being so restless that it's hard to sit still 0  Becoming easily annoyed or irritable 0  Feeling afraid as if something awful might happen 0  GAD-7 Score 0   Interventions:  Conducted a brief chart review Verbal administration of PHQ-9 and GAD-7 for symptom monitoring Provided empathic reflections and validation Reviewed content from the previous session Engaged patient in problem solving Psychoeducation provided regarding mindfulness Engaged patient in a mindfulness exercise Psychoeducation provided regarding  all-or-nothing thinking  DSM-5 Diagnosis: 311 (F32.8) Other Specified Depressive Disorder, Emotional Eating Behaviors  Treatment Goal & Progress: During the initial appointment with this provider, the following treatment goal was established: decrease emotional eating. Leimomi has demonstrated progress in her goal as evidenced by increased awareness of hunger patterns and triggers for emotional eating. Euleta also demonstrates willingness to engage in mindfulness exercises.  Plan: Clarrissa continues to appear able and willing to participate as evidenced by engagement in reciprocal conversation, and asking questions for clarification as appropriate. Nakkia acknowledged understanding this provider will be out of the office in the coming weeks; therefore, the next appointment will be scheduled in three weeks, which will be via [employer]. The next session will focus further on mindfulness.

## 2019-08-04 ENCOUNTER — Encounter (INDEPENDENT_AMBULATORY_CARE_PROVIDER_SITE_OTHER): Payer: Self-pay | Admitting: Family Medicine

## 2019-08-04 ENCOUNTER — Telehealth (INDEPENDENT_AMBULATORY_CARE_PROVIDER_SITE_OTHER): Payer: 59 | Admitting: Family Medicine

## 2019-08-04 ENCOUNTER — Other Ambulatory Visit: Payer: Self-pay

## 2019-08-04 DIAGNOSIS — Z6838 Body mass index (BMI) 38.0-38.9, adult: Secondary | ICD-10-CM

## 2019-08-04 DIAGNOSIS — F3289 Other specified depressive episodes: Secondary | ICD-10-CM

## 2019-08-04 DIAGNOSIS — R7303 Prediabetes: Secondary | ICD-10-CM

## 2019-08-04 MED ORDER — METFORMIN HCL 500 MG PO TABS: 500.0000 mg | ORAL_TABLET | Freq: Two times a day (BID) | ORAL | 0 refills | Status: DC

## 2019-08-04 MED ORDER — TOPIRAMATE 50 MG PO TABS: 50.0000 mg | ORAL_TABLET | Freq: Every day | ORAL | 0 refills | Status: DC

## 2019-08-04 MED FILL — TOPIRAMATE 50 MG TABLET: 50 | 30 days supply | Qty: 30 | Fill #0

## 2019-08-04 MED FILL — metFORMIN HCL 500 MG TABS: 500 | 30 days supply | Qty: 60 | Fill #0

## 2019-08-04 NOTE — Progress Notes (Signed)
Office: [phone]  /  Fax: [phone] TeleHealth Visit:  Melissa [name] has verbally consented to this TeleHealth visit today. The patient is located at home, the provider is located at the [employer] [facility] office. The participants in this visit include the listed provider and patient. The visit was conducted today via telephone call (Doxy failed - changed to telephone call).  HPI:   Chief Complaint: OBESITY Melissa [name] is here to discuss her progress with her obesity treatment plan. She is on the Category 2 plan and is following her eating plan approximately 50% of the time. She states she is walking 20 minutes 3-5 times per week. Melissa [name] is doing well with diet, but notes increased p.m. hunger and is struggling to stay on track. She states she weighed 210 lbs this a.m. at home. We were unable to weigh the patient today for this TeleHealth visit. She feels as if she has lost weight since her last visit. She has lost 5 lbs since starting treatment with [place].  Pre-Diabetes Melissa [name] has a diagnosis of prediabetes based on her elevated Hgb A1c and was informed this puts her at greater risk of developing diabetes. She is tolerating metformin well and notes decreased a.m. polyphagia but increased p.m. polyphagia. She is doing well on her diet prescription. She denies nausea or hypoglycemia.  Depression, Other Melissa [name] is struggling with emotional eating and using food for comfort to the extent that it is negatively impacting her health. She often snacks when she is not hungry. Melissa [name] sometimes feels she is out of control and then feels guilty that she made poor food choices. She has been working on behavior modification techniques to help reduce her emotional eating and has been somewhat successful. Melissa [name] notes increased emotional eating, worse in the p.m. she is on Pristiq and feels her mood is good, but sometimes feels out of control. No history of nephrolithiasis. She shows no sign of suicidal or  homicidal ideations.  Depression screen PHQ 2/9 05/18/2019  Decreased Interest 1  Down, Depressed, Hopeless 1  PHQ - 2 Score 2  Altered sleeping 0  Tired, decreased energy 2  Change in appetite 1  Feeling bad or failure about yourself  1  Trouble concentrating 2  Moving slowly or fidgety/restless 0  Suicidal thoughts 0  PHQ-9 Score 8  Difficult doing work/chores Not difficult at all   ASSESSMENT AND PLAN:  Prediabetes - Plan: metFORMIN (GLUCOPHAGE) 500 MG tablet  Other depression  Class 2 severe obesity with serious comorbidity and body mass index (BMI) of 38.0 to 38.9 in adult, unspecified obesity type (Melissa [name])  PLAN:  Pre-Diabetes Melissa [name] will continue to work on weight loss, exercise, and decreasing simple carbohydrates in her diet to help decrease the risk of diabetes. We dicussed metformin including benefits and risks. She was informed that eating too many simple carbohydrates or too many calories at one sitting increases the likelihood of GI side effects. Jakyla will increase metformin to 500 mg BID #60 with 0 refills and agrees to follow-up with our clinic in 2 weeks.  Depression with Emotional Eating Behaviors We discussed behavior modification techniques today to help Melissa [name] deal with her emotional eating and depression. Melissa [name] will start Topamax 50 mg QHS #30 with 0 refills and agrees to follow-up with our clinic in 2 weeks. She will continue Pristiq as is.  I spent > than 50% of the 25 minute visit on counseling as documented in the note.  Obesity Asli is currently in the action stage  of change. As such, her goal is to continue with weight loss efforts. She has agreed to follow the Category 2 plan.  Melvin has been instructed to work up to a goal of 150 minutes of combined cardio and strengthening exercise per week for weight loss and overall health benefits. We discussed the following Behavioral Modification Strategies today: increasing lean protein intake, work on meal  planning and easy cooking plans.  Evangelina has agreed to follow-up with our clinic in 2 weeks. She was informed of the importance of frequent follow-up visits to maximize her success with intensive lifestyle modifications for her multiple health conditions.  ALLERGIES: Allergies  Allergen Reactions  . Pumpkin Seed Itching    Itching mouth, wheezing    MEDICATIONS: Current Outpatient Medications on File Prior to Visit  Medication Sig Dispense Refill  . desvenlafaxine (PRISTIQ) 50 MG 24 hr tablet Take 50 mg by mouth daily.    . Multiple Vitamins-Minerals (MULTIVITAMIN WITH MINERALS) tablet Take 1 tablet by mouth daily.    . Omega-3 Fatty Acids (FISH OIL) 1200 MG CAPS Take 1 capsule by mouth daily.    . Vitamin D, Ergocalciferol, (DRISDOL) 1.25 MG (50000 UT) CAPS capsule TAKE 1 CAPSULE BY MOUTH EVERY 7 DAYS 4 capsule 0   No current facility-administered medications on file prior to visit.     PAST MEDICAL HISTORY: Past Medical History:  Diagnosis Date  . ADD (attention deficit disorder)   . Back pain   . Depression   . Dyspnea   . Heel pain   . Joint pain   . Leg pain   . Lower extremity edema   . Vitamin D deficiency     PAST SURGICAL HISTORY: Past Surgical History:  Procedure Laterality Date  . NOVASURE ABLATION    . plastic surgery arm      SOCIAL HISTORY: Social History   Tobacco Use  . Smoking status: Never Smoker  . Smokeless tobacco: Never Used  Substance Use Topics  . Alcohol use: Not on file  . Drug use: Not on file    FAMILY HISTORY: Family History  Problem Relation Age of Onset  . Diabetes Mother   . Hypertension Mother   . Hyperlipidemia Mother   . Stroke Mother   . Sudden death Mother   . Eating disorder Mother   . Obesity Mother   . Diabetes Father   . Hyperlipidemia Father   . Hypertension Father   . Cancer Father   . Eating disorder Father   . Obesity Father    ROS: Review of Systems  Gastrointestinal: Negative for nausea.   Endo/Heme/Allergies:       Negative for hypoglycemia.  Psychiatric/Behavioral: Positive for depression. Negative for suicidal ideas.       Negative for homicidal ideas.   PHYSICAL EXAM: Pt in no acute distress  RECENT LABS AND TESTS: BMET    Component Value Date/Time   NA 141 05/18/2019 1114   K 4.5 05/18/2019 1114   CL 101 05/18/2019 1114   CO2 19 (L) 05/18/2019 1114   GLUCOSE 108 (H) 05/18/2019 1114   BUN 13 05/18/2019 1114   CREATININE 0.72 05/18/2019 1114   CALCIUM 8.9 05/18/2019 1114   GFRNONAA 98 05/18/2019 1114   GFRAA 113 05/18/2019 1114   Lab Results  Component Value Date   HGBA1C 6.1 (H) 05/18/2019   Lab Results  Component Value Date   INSULIN 15.1 05/18/2019   CBC    Component Value Date/Time   WBC 7.8 05/18/2019  1114   RBC 5.06 05/18/2019 1114   HGB 15.6 05/18/2019 1114   HCT 46.5 05/18/2019 1114   MCV 92 05/18/2019 1114   MCH 30.8 05/18/2019 1114   MCHC 33.5 05/18/2019 1114   RDW 13.2 05/18/2019 1114   LYMPHSABS 2.2 05/18/2019 1114   EOSABS 0.1 05/18/2019 1114   BASOSABS 0.1 05/18/2019 1114   Iron/TIBC/Ferritin/ %Sat No results found for: IRON, TIBC, FERRITIN, IRONPCTSAT Lipid Panel     Component Value Date/Time   CHOL 191 05/18/2019 1114   TRIG 339 (H) 05/18/2019 1114   HDL 36 (L) 05/18/2019 1114   LDLCALC 87 05/18/2019 1114   Hepatic Function Panel     Component Value Date/Time   PROT 6.8 05/18/2019 1114   ALBUMIN 4.3 05/18/2019 1114   AST 41 (H) 05/18/2019 1114   ALT 80 (H) 05/18/2019 1114   ALKPHOS 85 05/18/2019 1114   BILITOT 0.5 05/18/2019 1114      Component Value Date/Time   TSH 2.530 05/18/2019 1114   Results for PAMEL, WOODWARD (MRN [id number]) as of 08/04/2019 15:38  Ref. Range 05/18/2019 11:14  Vitamin D, 25-Hydroxy Latest Ref Range: 30.0 - 100.0 ng/mL 23.8 (L)   I, Michaelene Song, am acting as Location manager for Dennard Nip, MD I have reviewed the above documentation for accuracy and completeness, and I agree with the  above. -Dennard Nip, MD

## 2019-08-05 ENCOUNTER — Ambulatory Visit (INDEPENDENT_AMBULATORY_CARE_PROVIDER_SITE_OTHER): Payer: 59 | Admitting: Family Medicine

## 2019-08-09 ENCOUNTER — Other Ambulatory Visit: Payer: Self-pay

## 2019-08-09 ENCOUNTER — Ambulatory Visit (INDEPENDENT_AMBULATORY_CARE_PROVIDER_SITE_OTHER): Payer: 59 | Admitting: Psychology

## 2019-08-09 DIAGNOSIS — F3289 Other specified depressive episodes: Secondary | ICD-10-CM | POA: Diagnosis not present

## 2019-08-17 NOTE — Progress Notes (Signed)
Office: [phone]  /  Fax: [phone]    Date: August 31, 2019   Appointment Start Time: 2:04pm Duration: 26 minutes Provider: Glennie Isle, Psy.D. Type of Session: Individual Therapy  Location of Patient: Home Location of Provider: [facility] Type of Contact: Telepsychological Visit via [employer] Web[employer]   Session Content: Melissa [name] is a 48 y.o. female presenting via [place] for a follow-up appointment to address the previously established treatment goal of decreasing emotional eating. Of note, the appointment was initiated late due to this provider. Today's appointment was a telepsychological visit, as it is an option for appointments to reduce exposure to COVID-19. Damian expressed understanding regarding the rationale for telepsychological services, and provided verbal consent for today's appointment. Prior to proceeding with today's appointment, Melissa [name]'s physical location at the time of this appointment was obtained. Melissa [name] reported she was at home and provided the address. In the event of technical difficulties, Melissa [name] shared a phone number she could be reached at. Melissa [name] and this provider participated in today's telepsychological service. Also, Melissa [name] denied anyone else being present in the room or on the WebEx appointment.  This provider conducted a brief check-in and verbally administered the PHQ-9 and GAD-7. Taqwa shared not much has changed, but shared she is focusing on planning a trip for her anniversary. Regarding eating, she noted it is going "pretty good" and denied episodes of emotional eating. However, she described difficulty eating vegetables. Thus, she was engaged in problem solving. Melissa [name] was receptive to adding spinach to frozen meals and adding vegetables to her eggs. It was also recommended she discuss other options with Dr. Leafy Ro during their next appointment (September 06, 2019). She agreed. Melissa [name] of session focused on mindfulness. She recalled turning  off the television and removing distractions while eating in order to "enjoy it." She discussed incorporating the five senses exercise. Positive reinforcement was provided. Psychoeducation regarding formal (e.g., setting aside a specific time daily to engage in an exercise) and informal (e.g., cultivating awareness in the present moment and taking a non-judgmental approach while engaging in day-to-day tasks) mindfulness was provided. This provider also discussed the utilization of YouTube for mindfulness exercises (e.g., videos by Merri Ray). In addition, psychoeducation regarding the hunger and satisfaction scale was provided to further increase awareness in the presenting moment while eating. Melissa [name] provided verbal consent during today's appointment for this provider to send the handout for the hunger and satisfaction scale via e-mail.  Melissa [name] was receptive to today's session as evidenced by openness to sharing, responsiveness to feedback, and willingness to continue engaging in mindfulness exercises.  Mental Status Examination:  Appearance: neat Behavior: cooperative Mood: euthymic Affect: mood congruent Speech: normal in rate, volume, and tone Eye Contact: appropriate Psychomotor Activity: appropriate Thought Process: linear, logical, and goal directed  Content/Perceptual Disturbances: no hallucinations, delusions, bizarre thinking or behavior reported or observed and no evidence of suicidal and homicidal ideation, plan, and intent Orientation: time, person, place and purpose of appointment Cognition/Sensorium: memory, attention, language, and fund of knowledge intact  Insight: good Judgment: good  Structured Assessment Results: The Patient Health Questionnaire-9 (PHQ-9) is a self-report measure that assesses symptoms and severity of depression over the course of the last two weeks. Melissa [name] obtained a score of 2 suggesting minimal depression. Melissa [name] finds the endorsed symptoms to be not difficult  at all. Little interest or pleasure in doing things 0  Feeling down, depressed, or hopeless 1  Trouble falling or staying asleep, or sleeping too much 0  Feeling  tired or having little energy 1  Poor appetite or overeating 0  Feeling bad about yourself --- or that you are a failure or have let yourself or your family down 0  Trouble concentrating on things, such as reading the newspaper or watching television 0  Moving or speaking so slowly that other people could have noticed? Or the opposite --- being so fidgety or restless that you have been moving around a lot more than usual 0  Thoughts that you would be better off dead or hurting yourself in some way 0  PHQ-9 Score 2    The Generalized Anxiety Disorder-7 (GAD-7) is a brief self-report measure that assesses symptoms of anxiety over the course of the last two weeks. Melissa [name] obtained a score of 3 suggesting minimal anxiety. Melissa [name] finds the endorsed symptoms to be not difficult at all. Feeling nervous, anxious, on edge- election 3  Not being able to stop or control worrying 0  Worrying too much about different things 0  Trouble relaxing 0  Being so restless that it's hard to sit still 0  Becoming easily annoyed or irritable 0  Feeling afraid as if something awful might happen 0  GAD-7 Score 3   Interventions:  Conducted a brief chart review Verbal administration of PHQ-9 and GAD-7 for symptom monitoring Provided empathic reflections and validation Reviewed content from the previous session Engaged patient in problem solving Psychoeducation provided regarding mindfulness Provided positive reinforcement Employed supportive psychotherapy interventions to facilitate reduced distress, and to improve coping skills with identified stressors  Psychoeducation provided regarding the hunger and satisfaction scale  DSM-5 Diagnosis: 311 (F32.8) Other Specified Depressive Disorder, Emotional Eating Behaviors  Treatment Goal & Progress: During  the initial appointment with this provider, the following treatment goal was established: decrease emotional eating. Melissa [name] has demonstrated progress in her goal as evidenced by increased awareness of hunger patterns and triggers for emotional eating. Melissa [name] also reported a reduction in emotional eating and demonstrates willingness to engage in mindfulness exercises.  Plan: Melissa [name] continues to appear able and willing to participate as evidenced by engagement in reciprocal conversation, and asking questions for clarification as appropriate. Per Melissa [name]'s request, the next appointment will be scheduled in three weeks, which will be via [employer]. The next session will focus further on mindfulness.

## 2019-08-19 ENCOUNTER — Other Ambulatory Visit: Payer: Self-pay

## 2019-08-19 ENCOUNTER — Encounter (INDEPENDENT_AMBULATORY_CARE_PROVIDER_SITE_OTHER): Payer: Self-pay | Admitting: Family Medicine

## 2019-08-19 ENCOUNTER — Ambulatory Visit (INDEPENDENT_AMBULATORY_CARE_PROVIDER_SITE_OTHER): Payer: 59 | Admitting: Family Medicine

## 2019-08-19 VITALS — BP 116/74 | HR 76 | Temp 97.8°F | Ht 63.0 in | Wt 208.0 lb

## 2019-08-19 DIAGNOSIS — R7303 Prediabetes: Secondary | ICD-10-CM

## 2019-08-19 DIAGNOSIS — Z6837 Body mass index (BMI) 37.0-37.9, adult: Secondary | ICD-10-CM

## 2019-08-19 DIAGNOSIS — F3289 Other specified depressive episodes: Secondary | ICD-10-CM | POA: Diagnosis not present

## 2019-08-19 MED FILL — DESVENLAFAXINE SUC ER 50 MG: 50 | 90 days supply | Qty: 90 | Fill #0

## 2019-08-24 NOTE — Progress Notes (Signed)
Office: [phone]  /  Fax: [phone]   HPI:   Chief Complaint: OBESITY Melissa [name] is here to discuss her progress with her obesity treatment plan. She is on the Category 2 plan and is following her eating plan approximately 50 % of the time. She states she is walking 20 minutes 4 times per week. Melissa [name]'s portion sizes are better. She has dinner for lunch at times. She is eating all the protein on the plan. She does not stick exactly to the plan. Her weight is 208 lb (94.3 kg) today and has had a weight loss of 6 pounds since her last visit. She has lost 11 lbs since starting treatment with [place].  Pre-Diabetes Melissa [name] has a diagnosis of prediabetes based on her elevated Hgb A1c and was informed this puts her at greater risk of developing diabetes. She is taking metformin and her dose was increased to two times per day at the last visit. She has had some GI upset. Melissa [name] continues to work on diet and exercise to decrease risk of diabetes. She denies polyphagia. Lab Results  Component Value Date   HGBA1C 6.1 (H) 05/18/2019    Depression with emotional eating behaviors Melissa [name]  Feels the Topamax is helping with nighttime eating. She is seeing Dr. Mallie Mussel. Melissa [name] struggles with emotional eating and using food for comfort to the extent that it is negatively impacting her health. She often snacks when she is not hungry. Melissa [name] sometimes feels she is out of control and then feels guilty that she made poor food choices. She has been working on behavior modification techniques to help reduce her emotional eating and has been somewhat successful. She shows no sign of suicidal or homicidal ideations.  ASSESSMENT AND PLAN:  Prediabetes  Other depression - with emotional eating  Class 2 severe obesity with serious comorbidity and body mass index (BMI) of 37.0 to 37.9 in adult, unspecified obesity type ([place])  PLAN:  Pre-Diabetes Melissa [name] will continue to work on weight loss, exercise, and decreasing simple  carbohydrates in her diet to help decrease the risk of diabetes. She was informed that eating too many simple carbohydrates or too many calories at one sitting increases the likelihood of GI side effects. Melissa [name] will continue metformin twice daily and follow up with [place] as directed to monitor her progress.  Depression with Emotional Eating Behaviors We discussed behavior modification techniques today to help Melissa [name] deal with her emotional eating and depression. Melissa [name] will continue seeing Dr. Mallie Mussel. She will continue Topamax and follow up as directed.  Obesity Melissa [name] is currently in the action stage of change. As such, her goal is to continue with weight loss efforts She has agreed to follow the Category 2 plan with breakfast options Melissa [name] will continue walking 20 minutes, 4 times per week for weight loss and overall health benefits. We discussed the following Behavioral Modification Strategies today: planning for success, increasing lean protein intake, increasing vegetables and work on meal planning and easy cooking plans  I advised her to stick as close to the plan as possible for optimal success. Breakfast options were added today.  Melissa [name] has agreed to follow up with our clinic in 2 to 3 weeks. She was informed of the importance of frequent follow up visits to maximize her success with intensive lifestyle modifications for her multiple health conditions.  ALLERGIES: Allergies  Allergen Reactions  . Pumpkin Seed Itching    Itching mouth, wheezing    MEDICATIONS: Current Outpatient Medications on File Prior to Visit  Medication Sig Dispense Refill  . desvenlafaxine (PRISTIQ) 50 MG 24 hr tablet Take 50 mg by mouth daily.    . metFORMIN (GLUCOPHAGE) 500 MG tablet Take 1 tablet (500 mg total) by mouth 2 (two) times daily with a meal. 60 tablet 0  . Multiple Vitamins-Minerals (MULTIVITAMIN WITH MINERALS) tablet Take 1 tablet by mouth daily.    . Omega-3 Fatty Acids (FISH OIL) 1200 MG CAPS Take  1 capsule by mouth daily.    Marland Kitchen topiramate (TOPAMAX) 50 MG tablet Take 1 tablet (50 mg total) by mouth daily. 30 tablet 0  . Vitamin D, Ergocalciferol, (DRISDOL) 1.25 MG (50000 UT) CAPS capsule TAKE 1 CAPSULE BY MOUTH EVERY 7 DAYS 4 capsule 0   No current facility-administered medications on file prior to visit.     PAST MEDICAL HISTORY: Past Medical History:  Diagnosis Date  . ADD (attention deficit disorder)   . Back pain   . Depression   . Dyspnea   . Heel pain   . Joint pain   . Leg pain   . Lower extremity edema   . Vitamin D deficiency     PAST SURGICAL HISTORY: Past Surgical History:  Procedure Laterality Date  . NOVASURE ABLATION    . plastic surgery arm      SOCIAL HISTORY: Social History   Tobacco Use  . Smoking status: Never Smoker  . Smokeless tobacco: Never Used  Substance Use Topics  . Alcohol use: Not on file  . Drug use: Not on file    FAMILY HISTORY: Family History  Problem Relation Age of Onset  . Diabetes Mother   . Hypertension Mother   . Hyperlipidemia Mother   . Stroke Mother   . Sudden death Mother   . Eating disorder Mother   . Obesity Mother   . Diabetes Father   . Hyperlipidemia Father   . Hypertension Father   . Cancer Father   . Eating disorder Father   . Obesity Father     ROS: Review of Systems  Constitutional: Positive for weight loss.  Gastrointestinal: Positive for diarrhea and nausea.  Endo/Heme/Allergies:       Negative for polyphagia  Psychiatric/Behavioral: Positive for depression. Negative for suicidal ideas.    PHYSICAL EXAM: Blood pressure 116/74, pulse 76, temperature 97.8 F (36.6 C), temperature source Oral, height 5\' 3"  (1.6 m), weight 208 lb (94.3 kg), SpO2 97 %. Body mass index is 36.85 kg/m. Physical Exam Vitals signs reviewed.  Constitutional:      Appearance: Normal appearance. She is well-developed. She is obese.  Cardiovascular:     Rate and Rhythm: Normal rate.  Pulmonary:     Effort:  Pulmonary effort is normal.  Musculoskeletal: Normal range of motion.  Skin:    General: Skin is warm and dry.  Neurological:     Mental Status: She is alert and oriented to person, place, and time.  Psychiatric:        Mood and Affect: Mood normal.        Behavior: Behavior normal.        Thought Content: Thought content does not include homicidal or suicidal ideation.     RECENT LABS AND TESTS: BMET    Component Value Date/Time   NA 141 05/18/2019 1114   K 4.5 05/18/2019 1114   CL 101 05/18/2019 1114   CO2 19 (L) 05/18/2019 1114   GLUCOSE 108 (H) 05/18/2019 1114   BUN 13 05/18/2019 1114   CREATININE 0.72 05/18/2019 1114  CALCIUM 8.9 05/18/2019 1114   GFRNONAA 98 05/18/2019 1114   GFRAA 113 05/18/2019 1114   Lab Results  Component Value Date   HGBA1C 6.1 (H) 05/18/2019   Lab Results  Component Value Date   INSULIN 15.1 05/18/2019   CBC    Component Value Date/Time   WBC 7.8 05/18/2019 1114   RBC 5.06 05/18/2019 1114   HGB 15.6 05/18/2019 1114   HCT 46.5 05/18/2019 1114   MCV 92 05/18/2019 1114   MCH 30.8 05/18/2019 1114   MCHC 33.5 05/18/2019 1114   RDW 13.2 05/18/2019 1114   LYMPHSABS 2.2 05/18/2019 1114   EOSABS 0.1 05/18/2019 1114   BASOSABS 0.1 05/18/2019 1114   Iron/TIBC/Ferritin/ %Sat No results found for: IRON, TIBC, FERRITIN, IRONPCTSAT Lipid Panel     Component Value Date/Time   CHOL 191 05/18/2019 1114   TRIG 339 (H) 05/18/2019 1114   HDL 36 (L) 05/18/2019 1114   LDLCALC 87 05/18/2019 1114   Hepatic Function Panel     Component Value Date/Time   PROT 6.8 05/18/2019 1114   ALBUMIN 4.3 05/18/2019 1114   AST 41 (H) 05/18/2019 1114   ALT 80 (H) 05/18/2019 1114   ALKPHOS 85 05/18/2019 1114   BILITOT 0.5 05/18/2019 1114      Component Value Date/Time   TSH 2.530 05/18/2019 1114     Ref. Range 05/18/2019 11:14  Vitamin D, 25-Hydroxy Latest Ref Range: 30.0 - 100.0 ng/mL 23.8 (L)    OBESITY BEHAVIORAL INTERVENTION VISIT  Today's  visit was # 6   Starting weight: 219 lbs Starting date: 05/18/2019 Today's weight : 208 lbs  Today's date: 08/19/2019 Total lbs lost to date: 11    08/19/2019  Height 5\' 3"  (1.6 m)  Weight 208 lb (94.3 kg)  BMI (Calculated) 36.85  BLOOD PRESSURE - SYSTOLIC 99991111  BLOOD PRESSURE - DIASTOLIC 74   Body Fat % 123XX123 %  Total Body Water (lbs) 79.2 lbs    ASK: We discussed the diagnosis of obesity with Melissa [name] today and Melissa [name] agreed to give [place] permission to discuss obesity behavioral modification therapy today.  ASSESS: Melissa [name] has the diagnosis of obesity and her BMI today is 36.85 Melissa [name] is in the action stage of change   ADVISE: Melissa [name] was educated on the multiple health risks of obesity as well as the benefit of weight loss to improve her health. She was advised of the need for long term treatment and the importance of lifestyle modifications to improve her current health and to decrease her risk of future health problems.  AGREE: Multiple dietary modification options and treatment options were discussed and  Melissa [name] agreed to follow the recommendations documented in the above note.  ARRANGE: Melissa [name] was educated on the importance of frequent visits to treat obesity as outlined per CMS and USPSTF guidelines and agreed to schedule her next follow up appointment today.  Melissa [name], am acting as Location manager for [employer], FNP-C.  I have reviewed the above documentation for accuracy and completeness, and I agree with the above.  - Melissa Boeh, FNP-C.

## 2019-08-25 ENCOUNTER — Encounter (INDEPENDENT_AMBULATORY_CARE_PROVIDER_SITE_OTHER): Payer: Self-pay | Admitting: Family Medicine

## 2019-08-25 DIAGNOSIS — F329 Major depressive disorder, single episode, unspecified: Secondary | ICD-10-CM | POA: Insufficient documentation

## 2019-08-25 DIAGNOSIS — Z6835 Body mass index (BMI) 35.0-35.9, adult: Secondary | ICD-10-CM | POA: Insufficient documentation

## 2019-08-25 DIAGNOSIS — R7303 Prediabetes: Secondary | ICD-10-CM | POA: Insufficient documentation

## 2019-08-25 DIAGNOSIS — F32A Depression, unspecified: Secondary | ICD-10-CM | POA: Insufficient documentation

## 2019-08-31 ENCOUNTER — Ambulatory Visit (INDEPENDENT_AMBULATORY_CARE_PROVIDER_SITE_OTHER): Payer: 59 | Admitting: Psychology

## 2019-08-31 ENCOUNTER — Other Ambulatory Visit (INDEPENDENT_AMBULATORY_CARE_PROVIDER_SITE_OTHER): Payer: Self-pay | Admitting: Family Medicine

## 2019-08-31 DIAGNOSIS — F3289 Other specified depressive episodes: Secondary | ICD-10-CM

## 2019-08-31 DIAGNOSIS — E559 Vitamin D deficiency, unspecified: Secondary | ICD-10-CM

## 2019-09-05 ENCOUNTER — Other Ambulatory Visit (INDEPENDENT_AMBULATORY_CARE_PROVIDER_SITE_OTHER): Payer: Self-pay | Admitting: Family Medicine

## 2019-09-05 DIAGNOSIS — R7303 Prediabetes: Secondary | ICD-10-CM

## 2019-09-06 ENCOUNTER — Telehealth (INDEPENDENT_AMBULATORY_CARE_PROVIDER_SITE_OTHER): Payer: Self-pay | Admitting: Family Medicine

## 2019-09-06 ENCOUNTER — Ambulatory Visit (INDEPENDENT_AMBULATORY_CARE_PROVIDER_SITE_OTHER): Payer: 59 | Admitting: Family Medicine
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
## 2019-09-07 ENCOUNTER — Other Ambulatory Visit (INDEPENDENT_AMBULATORY_CARE_PROVIDER_SITE_OTHER): Payer: Self-pay | Admitting: Family Medicine

## 2019-09-07 DIAGNOSIS — R7303 Prediabetes: Secondary | ICD-10-CM

## 2019-09-09 ENCOUNTER — Other Ambulatory Visit (INDEPENDENT_AMBULATORY_CARE_PROVIDER_SITE_OTHER): Payer: Self-pay

## 2019-09-09 ENCOUNTER — Telehealth (INDEPENDENT_AMBULATORY_CARE_PROVIDER_SITE_OTHER): Payer: Self-pay | Admitting: Family Medicine

## 2019-09-09 DIAGNOSIS — F3289 Other specified depressive episodes: Secondary | ICD-10-CM

## 2019-09-09 DIAGNOSIS — E559 Vitamin D deficiency, unspecified: Secondary | ICD-10-CM

## 2019-09-09 MED ORDER — VITAMIN D (ERGOCALCIFEROL) 1.25 MG (50000 UNIT) PO CAPS: ORAL_CAPSULE | ORAL | 0 refills | Status: DC

## 2019-09-09 MED ORDER — TOPIRAMATE 50 MG PO TABS: 50.0000 mg | ORAL_TABLET | Freq: Every day | ORAL | 0 refills | Status: DC

## 2019-09-09 MED FILL — VIT D2 1.25 MG (50,000 UNIT: 1.25 MG | 28 days supply | Qty: 4 | Fill #0

## 2019-09-09 MED FILL — TOPIRAMATE 50 MG TABLET: 50 | 30 days supply | Qty: 30 | Fill #0

## 2019-09-09 NOTE — Progress Notes (Signed)
Office: [phone]  /  Fax: [phone]    Date: September 21, 2019   Appointment Start Time: 2:01pm Duration: 24 minutes Provider: Glennie Isle, Psy.D. Type of Session: Individual Therapy  Location of Patient: Home Location of Provider: [facility] Type of Contact: Telepsychological Visit via [employer] WebEx   Session Content: Melissa [name] is a 48 y.o. female presenting via [employer] for a follow-up appointment to address the previously established treatment goal of decreasing emotional eating. Today's appointment was a telepsychological visit, as it is an option for appointments to reduce exposure to COVID-19. Melissa [name] expressed understanding regarding the rationale for telepsychological services, and provided verbal consent for today's appointment. Prior to proceeding with today's appointment, Melissa [name]'s physical location at the time of this appointment was obtained. In the event of technical difficulties, Melissa [name] shared a phone number she could be reached at. Melissa [name] and this provider participated in today's telepsychological service. Also, Melissa [name] denied anyone else being present in the room or on the WebEx appointment.  This provider conducted a brief check-in and verbally administered the PHQ-9 and GAD-7. Melissa [name] shared she is "getting ready for the holidays." She shared her eating habits have "not been great." Melissa [name] further shared she "indulged in candy" during Halloween and disclosed deviations from the structured meal plan (e.g., ordering take out). Thus, this provider explored obstacles/barriers to following the structured meal plan. Melissa [name] believes boredom contributed to deviations; therefore, it was recommended she discuss options with Dr. Leafy Ro (October 04, 2019). She agreed. Moreover, psychoeducation regarding common cognitive distortions associated with emotional eating was provided. Melissa [name] provided verbal consent during today's appointment for this provider to send the handout  about cognitive distortions via e-mail. This provider also discussed using the 5-4-3-2-1 mindfulness exercise to assist with coping. Melissa [name] was receptive to today's session as evidenced by openness to sharing, responsiveness to feedback, and willingness to explore cognitive distortions.  Mental Status Examination:  Appearance: neat Behavior: cooperative Mood: euthymic Affect: mood congruent Speech: normal in rate, volume, and tone Eye Contact: appropriate Psychomotor Activity: appropriate  Thought Process: linear, logical, and goal directed  Content/Perceptual Disturbances: no hallucinations, delusions, bizarre thinking or behavior reported or observed and no evidence of suicidal and homicidal ideation, plan, and intent Orientation: time, person, place and purpose of appointment Cognition/Sensorium: memory, attention, language, and fund of knowledge intact  Insight: good Judgment: good  Structured Assessment Results: The Patient Health Questionnaire-9 (PHQ-9) is a self-report measure that assesses symptoms and severity of depression over the course of the last two weeks. Melissa [name] obtained a score of 0. Little interest or pleasure in doing things 0  Feeling down, depressed, or hopeless 0  Trouble falling or staying asleep, or sleeping too much 0  Feeling tired or having little energy 0  Poor appetite or overeating 0  Feeling bad about yourself --- or that you are a failure or have let yourself or your family down 0  Trouble concentrating on things, such as reading the newspaper or watching television 0  Moving or speaking so slowly that other people could have noticed? Or the opposite --- being so fidgety or restless that you have been moving around a lot more than usual 0  Thoughts that you would be better off dead or hurting yourself in some way 0  PHQ-9 Score 0    The Generalized Anxiety Disorder-7 (GAD-7) is a brief self-report measure that assesses symptoms of anxiety over the course of  the last two weeks. Melissa [name] obtained a score of 0.  Feeling nervous, anxious, on edge 0  Not being able to stop or control worrying 0  Worrying too much about different things 0  Trouble relaxing 0  Being so restless that it's hard to sit still 0  Becoming easily annoyed or irritable 0  Feeling afraid as if something awful might happen 0  GAD-7 Score 0   Interventions:  Conducted a brief chart review Provided empathic reflections and validation Reviewed content from the previous session Psychoeducation provided regarding cognitive distortions  Employed motivational interviewing skills to assess patient's willingness/desire to adhere to recommended medical treatments and assignments Employed supportive psychotherapy interventions to facilitate reduced distress, and to improve coping skills with identified stressors  DSM-5 Diagnosis: 311 (F32.8) Other Specified Depressive Disorder, Emotional Eating Behaviors  Treatment Goal & Progress: During the initial appointment with this provider, the following treatment goal was established: decrease emotional eating. Melissa [name] has demonstrated progress in her goal as evidenced by increased awareness of hunger patterns and triggers for emotional eating. Melissa [name] also continues to demonstrate willingness to engage in learned skill(s).  Plan: Melissa [name] continues to appear able and willing to participate as evidenced by engagement in reciprocal conversation, and asking questions for clarification as appropriate. The next appointment will be scheduled in 2-3 weeks, which will be via [employer]. The next session will focus on reviewing learned skills, and working towards the established treatment goal.

## 2019-09-09 NOTE — Telephone Encounter (Signed)
Will send in approved Vit D and Topamax to pharmacy

## 2019-09-09 NOTE — Telephone Encounter (Signed)
Approved medications sent to pharmacy.Metformin was not approved

## 2019-09-10 ENCOUNTER — Other Ambulatory Visit (INDEPENDENT_AMBULATORY_CARE_PROVIDER_SITE_OTHER): Payer: Self-pay | Admitting: Family Medicine

## 2019-09-10 DIAGNOSIS — R7303 Prediabetes: Secondary | ICD-10-CM

## 2019-09-13 MED FILL — ADDERALL XR 15 MG CAP SA: 15 | 30 days supply | Qty: 30 | Fill #0

## 2019-09-15 ENCOUNTER — Telehealth (INDEPENDENT_AMBULATORY_CARE_PROVIDER_SITE_OTHER): Payer: 59 | Admitting: Family Medicine

## 2019-09-15 ENCOUNTER — Encounter (INDEPENDENT_AMBULATORY_CARE_PROVIDER_SITE_OTHER): Payer: Self-pay | Admitting: Family Medicine

## 2019-09-15 ENCOUNTER — Other Ambulatory Visit: Payer: Self-pay

## 2019-09-15 DIAGNOSIS — R7303 Prediabetes: Secondary | ICD-10-CM | POA: Diagnosis not present

## 2019-09-15 DIAGNOSIS — Z6836 Body mass index (BMI) 36.0-36.9, adult: Secondary | ICD-10-CM | POA: Diagnosis not present

## 2019-09-15 MED ORDER — METFORMIN HCL 500 MG PO TABS: 500.0000 mg | ORAL_TABLET | Freq: Two times a day (BID) | ORAL | 0 refills | Status: DC

## 2019-09-15 MED FILL — metFORMIN HCL 500 MG TABS: 500 | 30 days supply | Qty: 60 | Fill #0

## 2019-09-15 NOTE — Progress Notes (Signed)
Office: [phone]  /  Fax: [phone] TeleHealth Visit:  Melissa [name] has verbally consented to this TeleHealth visit today. The patient is located at home, the provider is located at the [employer] [facility] office. The participants in this visit include the listed provider and patient. The visit was conducted today via Webex.  HPI:   Chief Complaint: OBESITY Melissa [name] is here to discuss her progress with her obesity treatment plan. She is on the Category 2 plan and is following her eating plan approximately 50% of the time. She states she is walking 15-20 minutes 5 times per week. Melissa [name] feels she has lost another 1-2 lbs in the last month. She is trying to be mindful of her food choices, but is not following her plan strictly. Hunger is controlled. We were unable to weigh the patient today for this TeleHealth visit. She feels as if she has lost 1-2 lbs since her last visit. She has lost 11 lbs since starting treatment with [place].  Pre-Diabetes Melissa [name] has a diagnosis of prediabetes based on her elevated Hgb A1c and was informed this puts her at greater risk of developing diabetes. She is tolerating metformin well and has tried to increase her walking. She states polyphagia is decreased. She denies nausea or hypoglycemia.  ASSESSMENT AND PLAN:  Prediabetes - Plan: metFORMIN (GLUCOPHAGE) 500 MG tablet  Class 2 severe obesity with serious comorbidity and body mass index (BMI) of 36.0 to 36.9 in adult, unspecified obesity type ([place])  PLAN:  Pre-Diabetes Melissa [name] will continue to work on weight loss, exercise, and decreasing simple carbohydrates in her diet to help decrease the risk of diabetes. We dicussed metformin including benefits and risks. She was informed that eating too many simple carbohydrates or too many calories at one sitting increases the likelihood of GI side effects. Melissa [name] was given a refill on her metformin 500 mg BID #60 with 0 refills. She agrees to follow-up with our  clinic in 2-3 weeks at which time she will have labs checked.  Obesity Melissa [name] is currently in the action stage of change. As such, her goal is to continue with weight loss efforts. She has agreed to follow the Category 2 plan. Melissa [name] has been instructed to work up to a goal of 150 minutes of combined cardio and strengthening exercise per week for weight loss and overall health benefits. We discussed the following Behavioral Modification Strategies today: work on meal planning and easy cooking plans, and holiday eating strategies.  Melissa [name] has agreed to follow-up with our clinic in 2-3 weeks. She was informed of the importance of frequent follow-up visits to maximize her success with intensive lifestyle modifications for her multiple health conditions.  ALLERGIES: Allergies  Allergen Reactions  . Pumpkin Seed Itching    Itching mouth, wheezing    MEDICATIONS: Current Outpatient Medications on File Prior to Visit  Medication Sig Dispense Refill  . desvenlafaxine (PRISTIQ) 50 MG 24 hr tablet Take 50 mg by mouth daily.    . metFORMIN (GLUCOPHAGE) 500 MG tablet Take 1 tablet (500 mg total) by mouth 2 (two) times daily with a meal. 60 tablet 0  . Multiple Vitamins-Minerals (MULTIVITAMIN WITH MINERALS) tablet Take 1 tablet by mouth daily.    . Omega-3 Fatty Acids (FISH OIL) 1200 MG CAPS Take 1 capsule by mouth daily.    Marland Kitchen topiramate (TOPAMAX) 50 MG tablet Take 1 tablet (50 mg total) by mouth daily. 30 tablet 0  . Vitamin D, Ergocalciferol, (DRISDOL) 1.25 MG (50000 UT) CAPS  capsule TAKE 1 CAPSULE BY MOUTH EVERY 7 DAYS 4 capsule 0   No current facility-administered medications on file prior to visit.     PAST MEDICAL HISTORY: Past Medical History:  Diagnosis Date  . ADD (attention deficit disorder)   . Back pain   . Depression   . Dyspnea   . Heel pain   . Joint pain   . Leg pain   . Lower extremity edema   . Vitamin D deficiency     PAST SURGICAL HISTORY: Past Surgical History:   Procedure Laterality Date  . NOVASURE ABLATION    . plastic surgery arm      SOCIAL HISTORY: Social History   Tobacco Use  . Smoking status: Never Smoker  . Smokeless tobacco: Never Used  Substance Use Topics  . Alcohol use: Not on file  . Drug use: Not on file    FAMILY HISTORY: Family History  Problem Relation Age of Onset  . Diabetes Mother   . Hypertension Mother   . Hyperlipidemia Mother   . Stroke Mother   . Sudden death Mother   . Eating disorder Mother   . Obesity Mother   . Diabetes Father   . Hyperlipidemia Father   . Hypertension Father   . Cancer Father   . Eating disorder Father   . Obesity Father    ROS: Review of Systems  Gastrointestinal: Negative for nausea.  Endo/Heme/Allergies:       Negative for hypoglycemia. Positive for decreased polyphagia.   PHYSICAL EXAM: Pt in no acute distress  RECENT LABS AND TESTS: BMET    Component Value Date/Time   NA 141 05/18/2019 1114   K 4.5 05/18/2019 1114   CL 101 05/18/2019 1114   CO2 19 (L) 05/18/2019 1114   GLUCOSE 108 (H) 05/18/2019 1114   BUN 13 05/18/2019 1114   CREATININE 0.72 05/18/2019 1114   CALCIUM 8.9 05/18/2019 1114   GFRNONAA 98 05/18/2019 1114   GFRAA 113 05/18/2019 1114   Lab Results  Component Value Date   HGBA1C 6.1 (H) 05/18/2019   Lab Results  Component Value Date   INSULIN 15.1 05/18/2019   CBC    Component Value Date/Time   WBC 7.8 05/18/2019 1114   RBC 5.06 05/18/2019 1114   HGB 15.6 05/18/2019 1114   HCT 46.5 05/18/2019 1114   MCV 92 05/18/2019 1114   MCH 30.8 05/18/2019 1114   MCHC 33.5 05/18/2019 1114   RDW 13.2 05/18/2019 1114   LYMPHSABS 2.2 05/18/2019 1114   EOSABS 0.1 05/18/2019 1114   BASOSABS 0.1 05/18/2019 1114   Iron/TIBC/Ferritin/ %Sat No results found for: IRON, TIBC, FERRITIN, IRONPCTSAT Lipid Panel     Component Value Date/Time   CHOL 191 05/18/2019 1114   TRIG 339 (H) 05/18/2019 1114   HDL 36 (L) 05/18/2019 1114   LDLCALC 87 05/18/2019  1114   Hepatic Function Panel     Component Value Date/Time   PROT 6.8 05/18/2019 1114   ALBUMIN 4.3 05/18/2019 1114   AST 41 (H) 05/18/2019 1114   ALT 80 (H) 05/18/2019 1114   ALKPHOS 85 05/18/2019 1114   BILITOT 0.5 05/18/2019 1114      Component Value Date/Time   TSH 2.530 05/18/2019 1114   Results for SHRENA, BELMAREZ (MRN [id number]) as of 09/15/2019 12:11  Ref. Range 05/18/2019 11:14  Vitamin D, 25-Hydroxy Latest Ref Range: 30.0 - 100.0 ng/mL 23.8 (L)   I, Michaelene Song, am acting as Location manager for Dennard Nip, MD I have  reviewed the above documentation for accuracy and completeness, and I agree with the above. -Dennard Nip, MD

## 2019-09-21 ENCOUNTER — Other Ambulatory Visit: Payer: Self-pay

## 2019-09-21 ENCOUNTER — Ambulatory Visit (INDEPENDENT_AMBULATORY_CARE_PROVIDER_SITE_OTHER): Payer: 59 | Admitting: Psychology

## 2019-09-21 DIAGNOSIS — F3289 Other specified depressive episodes: Secondary | ICD-10-CM | POA: Diagnosis not present

## 2019-09-27 NOTE — Progress Notes (Signed)
Office: [phone]  /  Fax: [phone]    Date: October 07, 2019   Appointment Start Time: 2:05pm Duration: 26 minutes Provider: Glennie Isle, Psy.D. Type of Session: Individual Therapy  Location of Patient: Parked in car at [employer] of Provider: [facility] [employer] [facility] Type of Contact: Telepsychological Visit via Telephone Call  Session Content: Of note, this provider called Melissa [name] at 2:02pm as she did not present for the [facility] appointment. She noted she forgot about today's appointment and shared she was out running errands. She requested to proceed with today's appointment via telephone and agreed to park her car for the duration of the appointment. As such, today's appointment was initiated 5 minutes late.    Melissa [name] is a 48 y.o. female presenting via telephone for a follow-up appointment to address the previously established treatment goal of decreasing emotional eating. Today's appointment was a telepsychological visit, as it is an option for appointments to reduce exposure to COVID-19. Melissa [name] expressed understanding regarding the rationale for telepsychological services, and provided verbal consent for today's appointment. Prior to proceeding with today's appointment, Melissa [name]'s physical location at the time of this appointment was obtained. Melissa [name] and this provider participated in today's telepsychological service. Also, Melissa [name] denied anyone else being present in the car or on the phone call.  This provider conducted a brief check-in. Melissa [name] shared she started Saxenda and will be continuing with Topamax. She disclosed she did not "stick to the diet" during Thanksgiving," but indicated engaging in portion control and making better choices. She discussed dinner continues to be challenging in regard to eating congruent to her meal plan. Nevertheless, Melissa [name] reported planning ahead has been helpful and she continues to report a reduction in emotional eating. Notably, she  acknowledged she occasionally engages in eating secondary to boredom. Additionally, mindfulness was further discussed. She shared, "I haven't totally been doing mindfulness;" however, she shared an instance where she was mindful about ingredients in a pie. The rationale for practicing regularly was reviewed. Cognitive distortions were reviewed and she noted, "I'm trying not be that all or nothing." Remainder of today's appointment focused on coping with boredom in the afternoon to reduce emotional eating. Psychoeducation regarding pleasurable activities, including its impact on emotional eating and overall well-being was provided. Melissa [name] was provided with a handout with various options of pleasurable activities, and was encouraged to engage in one activity a day and additional activities as needed when triggered to emotionally eat. Melissa [name] agreed. Melissa [name] provided verbal consent during today's appointment for this provider to send a handout about pleasurable activities via e-mail. She was encouraged to continue engaging in mindfulness to further assist with coping Furthermore, termination planning was discussed. A follow-up appointment will be scheduled in approximately four weeks and an additional follow-up/termination appointment will be scheduled in four weeks after that. Overall, Melissa [name] was receptive to today's session as evidenced by openness to sharing, responsiveness to feedback, and willingness to engage in learned skills.  Mental Status Examination:  Appearance: unable to assess  Behavior: unable to assess Mood: euthymic Affect: unable to fully assess Speech: normal in rate, volume, and tone Eye Contact: unable to assess Psychomotor Activity: unable to assess Thought Process: linear, logical, and goal directed  Content/Perceptual Disturbances: no hallucinations, delusions, bizarre thinking or behavior reported or observed and no evidence of suicidal and homicidal ideation, plan, and  intent Orientation: time, person, place and purpose of appointment Cognition/Sensorium: memory, attention, language, and fund of knowledge intact  Insight: good Judgment: good  Interventions:  Conducted a brief chart review Provided empathic reflections and validation Reviewed content from the previous session Psychoeducation provided regarding pleasurable activities Employed motivational interviewing skills to assess patient's willingness/desire to adhere to recommended medical treatments and assignments Reviewed learned skills Discussed termination planning Employed supportive psychotherapy interventions to facilitate reduced distress, and to improve coping skills with identified stressors  DSM-5 Diagnosis: 311 (F32.8) Other Specified Depressive Disorder, Emotional Eating Behaviors  Treatment Goal & Progress: During the initial appointment with this provider, the following treatment goal was established: decrease emotional eating. Melissa [name] has demonstrated progress in her goal as evidenced by increased awareness of hunger patterns and triggers for emotional eating. Melissa [name] also reported a reduction in emotional eating and demonstrates willingness to engage in pleasurable activities.  Plan: Melissa [name] continues to appear able and willing to participate as evidenced by engagement in reciprocal conversation, and asking questions for clarification as appropriate. The next appointment will be scheduled in one month, which will be via [employer]. The next session will focus on reviewing learned skills, and working towards the established treatment goal.

## 2019-09-28 DIAGNOSIS — F900 Attention-deficit hyperactivity disorder, predominantly inattentive type: Secondary | ICD-10-CM | POA: Diagnosis not present

## 2019-09-28 DIAGNOSIS — F3341 Major depressive disorder, recurrent, in partial remission: Secondary | ICD-10-CM | POA: Diagnosis not present

## 2019-10-04 ENCOUNTER — Encounter (INDEPENDENT_AMBULATORY_CARE_PROVIDER_SITE_OTHER): Payer: Self-pay | Admitting: Family Medicine

## 2019-10-04 ENCOUNTER — Ambulatory Visit (INDEPENDENT_AMBULATORY_CARE_PROVIDER_SITE_OTHER): Payer: 59 | Admitting: Family Medicine

## 2019-10-04 ENCOUNTER — Other Ambulatory Visit: Payer: Self-pay

## 2019-10-04 VITALS — BP 119/79 | HR 76 | Temp 97.8°F | Ht 63.0 in | Wt 204.0 lb

## 2019-10-04 DIAGNOSIS — R7303 Prediabetes: Secondary | ICD-10-CM | POA: Diagnosis not present

## 2019-10-04 DIAGNOSIS — Z9189 Other specified personal risk factors, not elsewhere classified: Secondary | ICD-10-CM

## 2019-10-04 DIAGNOSIS — F3289 Other specified depressive episodes: Secondary | ICD-10-CM

## 2019-10-04 DIAGNOSIS — Z6835 Body mass index (BMI) 35.0-35.9, adult: Secondary | ICD-10-CM | POA: Diagnosis not present

## 2019-10-04 DIAGNOSIS — E559 Vitamin D deficiency, unspecified: Secondary | ICD-10-CM | POA: Diagnosis not present

## 2019-10-04 MED ORDER — VITAMIN D (ERGOCALCIFEROL) 1.25 MG (50000 UNIT) PO CAPS: ORAL_CAPSULE | ORAL | 0 refills | Status: DC

## 2019-10-04 MED ORDER — BD PEN NEEDLE NANO 2ND GEN 32G X 4 MM MISC: 1.0000 | Freq: Two times a day (BID) | 0 refills | Status: DC

## 2019-10-04 MED ORDER — TOPIRAMATE 50 MG PO TABS: 50.0000 mg | ORAL_TABLET | Freq: Every day | ORAL | 0 refills | Status: DC

## 2019-10-04 MED ORDER — METFORMIN HCL 500 MG PO TABS: 500.0000 mg | ORAL_TABLET | Freq: Two times a day (BID) | ORAL | 0 refills | Status: DC

## 2019-10-04 MED ORDER — SAXENDA 18 MG/3ML ~~LOC~~ SOPN: 3.0000 mg | PEN_INJECTOR | Freq: Every day | SUBCUTANEOUS | 0 refills | Status: DC

## 2019-10-04 MED FILL — TOPIRAMATE 50 MG TABLET: 50 | 30 days supply | Qty: 30 | Fill #0

## 2019-10-04 MED FILL — VIT D2 1.25 MG (50,000 UNIT: 1.25 MG | 28 days supply | Qty: 4 | Fill #0

## 2019-10-04 NOTE — Progress Notes (Signed)
Office: [phone]  /  Fax: [phone]   HPI:   Chief Complaint: OBESITY Melissa [name] is here to discuss her progress with her obesity treatment plan. She is on the Category 2 plan and is following her eating plan approximately 25% of the time. She states she is walking 30 minutes 5 times per week. Melissa [name]'s last in-office visit was 6 weeks ago. She did well with weight loss even over Thanksgiving, but notes some increased polyphagia.  Her weight is 204 lb (92.5 kg) today and has had a weight loss of 4 pounds over a period of 7 weeks since her last visit. She has lost 15 lbs since starting treatment with [place].  Depression, Other  Melissa [name] is struggling with emotional eating and using food for comfort to the extent that it is negatively impacting her health. She often snacks when she is not hungry. Melissa [name] sometimes feels she is out of control and then feels guilty that she made poor food choices. She has been working on behavior modification techniques to help reduce her emotional eating and has been somewhat successful. Melissa [name] reports her mood is stable on Topamax. She had stopped for a few days and noted increased headaches. She shows no sign of suicidal or homicidal ideations.  Depression screen PHQ 2/9 05/18/2019  Decreased Interest 1  Down, Depressed, Hopeless 1  PHQ - 2 Score 2  Altered sleeping 0  Tired, decreased energy 2  Change in appetite 1  Feeling bad or failure about yourself  1  Trouble concentrating 2  Moving slowly or fidgety/restless 0  Suicidal thoughts 0  PHQ-9 Score 8  Difficult doing work/chores Not difficult at all   Vitamin D deficiency Melissa [name] has a diagnosis of Vitamin D deficiency. She is currently stable on prescription Vit D and requests a refill. She denies nausea, vomiting or muscle weakness.  Pre-Diabetes Melissa [name] has a diagnosis of prediabetes based on her elevated Hgb A1c and was informed this puts her at greater risk of developing diabetes. She is stable on  metformin currently and still notes polyphagia. She continues to work on diet and exercise to decrease risk of diabetes. She denies nausea or hypoglycemia.  At risk for cardiovascular disease Melissa [name] is at a higher than average risk for cardiovascular disease due to obesity. She currently denies any chest pain.  ASSESSMENT AND PLAN:  Vitamin D deficiency - Plan: Vitamin D, Ergocalciferol, (DRISDOL) 1.25 MG (50000 UT) CAPS capsule  Prediabetes - Plan: metFORMIN (GLUCOPHAGE) 500 MG tablet  Other depression, emotional eating - Plan: topiramate (TOPAMAX) 50 MG tablet  At risk for heart disease  Class 2 severe obesity with serious comorbidity and body mass index (BMI) of 35.0 to 35.9 in adult, unspecified obesity type (Melissa [name]) - Plan: Liraglutide -Weight Management (SAXENDA) 18 MG/3ML SOPN, Insulin Pen Needle (BD PEN NEEDLE NANO 2ND GEN) 32G X 4 MM MISC  PLAN:  Depression with Emotional Eating Behaviors We discussed behavior modification techniques today to help Melissa [name] deal with her emotional eating behaviors. Melissa [name] was given a refill on her Topamax 50 mg #30 with 0 refills and agrees to follow-up with our clinic in 2-3 weeks.  Vitamin D Deficiency Melissa [name] was informed that low Vitamin D levels contributes to fatigue and are associated with obesity, breast, and colon cancer. She agrees to continue to take prescription Vit D @ 50,000 IU every week #4 with 0 refills and will follow-up for routine testing of Vitamin D at her next visit. She was informed of the risk of  over-replacement of Vitamin D and agrees to not increase her dose unless she discusses this with [place] first. Melissa [name] agrees to follow-up with our clinic in 2-3 weeks.  Pre-Diabetes Melissa [name] will continue to work on weight loss, exercise, and decreasing simple carbohydrates in her diet to help decrease the risk of diabetes. We dicussed metformin including benefits and risks. She was informed that eating too many simple carbohydrates or too many  calories at one sitting increases the likelihood of GI side effects. Melissa [name] was given a refill on her metformin and will start liraglutide. She will have labs checked at her next visit.  Cardiovascular risk counseling Melissa [name] was given extended (15 minutes) coronary artery disease prevention counseling today. She is 48 y.o. female and has risk factors for heart disease including obesity. We discussed intensive lifestyle modifications today with an emphasis on specific weight loss instructions and strategies. Pt was also informed of the importance of increasing exercise and decreasing saturated fats to help prevent heart disease.  Obesity Melissa [name] is currently in the action stage of change. As such, her goal is to continue with weight loss efforts. She has agreed to follow the Category 2 plan. Melissa [name] has been instructed to work up to a goal of 150 minutes of combined cardio and strengthening exercise per week for weight loss and overall health benefits. We discussed the following Behavioral Modification Strategies today: increasing lean protein intake, decreasing simple carbohydrates, no skipping meals.  Melissa [name] will start Saxenda 0.6 mg (prescription for 3.0 mg) QAM #5 pens with 0 refills and was given a prescription for nano needles.  Melissa [name] has agreed to follow-up with our clinic in 2-3 weeks. She was informed of the importance of frequent follow-up visits to maximize her success with intensive lifestyle modifications for her multiple health conditions.  ALLERGIES: Allergies  Allergen Reactions  . Pumpkin Seed Itching    Itching mouth, wheezing    MEDICATIONS: Current Outpatient Medications on File Prior to Visit  Medication Sig Dispense Refill  . desvenlafaxine (PRISTIQ) 50 MG 24 hr tablet Take 50 mg by mouth daily.    . Multiple Vitamins-Minerals (MULTIVITAMIN WITH MINERALS) tablet Take 1 tablet by mouth daily.    . Omega-3 Fatty Acids (FISH OIL) 1200 MG CAPS Take 1 capsule by mouth daily.      No current facility-administered medications on file prior to visit.     PAST MEDICAL HISTORY: Past Medical History:  Diagnosis Date  . ADD (attention deficit disorder)   . Back pain   . Depression   . Dyspnea   . Heel pain   . Joint pain   . Leg pain   . Lower extremity edema   . Vitamin D deficiency     PAST SURGICAL HISTORY: Past Surgical History:  Procedure Laterality Date  . NOVASURE ABLATION    . plastic surgery arm      SOCIAL HISTORY: Social History   Tobacco Use  . Smoking status: Never Smoker  . Smokeless tobacco: Never Used  Substance Use Topics  . Alcohol use: Not on file  . Drug use: Not on file    FAMILY HISTORY: Family History  Problem Relation Age of Onset  . Diabetes Mother   . Hypertension Mother   . Hyperlipidemia Mother   . Stroke Mother   . Sudden death Mother   . Eating disorder Mother   . Obesity Mother   . Diabetes Father   . Hyperlipidemia Father   . Hypertension Father   . Cancer Father   .  Eating disorder Father   . Obesity Father    ROS: Review of Systems  Gastrointestinal: Negative for nausea and vomiting.  Musculoskeletal:       Negative for muscle weakness.  Endo/Heme/Allergies:       Negative for hypoglycemia. Positive for some polyphagia.  Psychiatric/Behavioral: Positive for depression. Negative for suicidal ideas.       Negative for homicidal ideas.   PHYSICAL EXAM: Blood pressure 119/79, pulse 76, temperature 97.8 F (36.6 C), temperature source Oral, height 5\' 3"  (1.6 m), weight 204 lb (92.5 kg), SpO2 97 %. Body mass index is 36.14 kg/m. Physical Exam Vitals signs reviewed.  Constitutional:      Appearance: Normal appearance. She is obese.  Cardiovascular:     Rate and Rhythm: Normal rate.     Pulses: Normal pulses.  Pulmonary:     Effort: Pulmonary effort is normal.     Breath sounds: Normal breath sounds.  Musculoskeletal: Normal range of motion.  Skin:    General: Skin is warm and dry.   Neurological:     Mental Status: She is alert and oriented to person, place, and time.  Psychiatric:        Behavior: Behavior normal.   RECENT LABS AND TESTS: BMET    Component Value Date/Time   NA 141 05/18/2019 1114   K 4.5 05/18/2019 1114   CL 101 05/18/2019 1114   CO2 19 (L) 05/18/2019 1114   GLUCOSE 108 (H) 05/18/2019 1114   BUN 13 05/18/2019 1114   CREATININE 0.72 05/18/2019 1114   CALCIUM 8.9 05/18/2019 1114   GFRNONAA 98 05/18/2019 1114   GFRAA 113 05/18/2019 1114   Lab Results  Component Value Date   HGBA1C 6.1 (H) 05/18/2019   Lab Results  Component Value Date   INSULIN 15.1 05/18/2019   CBC    Component Value Date/Time   WBC 7.8 05/18/2019 1114   RBC 5.06 05/18/2019 1114   HGB 15.6 05/18/2019 1114   HCT 46.5 05/18/2019 1114   MCV 92 05/18/2019 1114   MCH 30.8 05/18/2019 1114   MCHC 33.5 05/18/2019 1114   RDW 13.2 05/18/2019 1114   LYMPHSABS 2.2 05/18/2019 1114   EOSABS 0.1 05/18/2019 1114   BASOSABS 0.1 05/18/2019 1114   Iron/TIBC/Ferritin/ %Sat No results found for: IRON, TIBC, FERRITIN, IRONPCTSAT Lipid Panel     Component Value Date/Time   CHOL 191 05/18/2019 1114   TRIG 339 (H) 05/18/2019 1114   HDL 36 (L) 05/18/2019 1114   LDLCALC 87 05/18/2019 1114   Hepatic Function Panel     Component Value Date/Time   PROT 6.8 05/18/2019 1114   ALBUMIN 4.3 05/18/2019 1114   AST 41 (H) 05/18/2019 1114   ALT 80 (H) 05/18/2019 1114   ALKPHOS 85 05/18/2019 1114   BILITOT 0.5 05/18/2019 1114      Component Value Date/Time   TSH 2.530 05/18/2019 1114   Results for CARALYN, BECKET (MRN [id number]) as of 10/04/2019 14:25  Ref. Range 05/18/2019 11:14  Vitamin D, 25-Hydroxy Latest Ref Range: 30.0 - 100.0 ng/mL 23.8 (L)   OBESITY BEHAVIORAL INTERVENTION VISIT  Today's visit was #8  Starting weight: 219 lbs Starting date: 05/18/2019 Today's weight: 204 lbs  Today's date: 10/04/2019 Total lbs lost to date: 15     10/04/2019  Height 5\' 3"  (1.6 m)   Weight 204 lb (92.5 kg)  BMI (Calculated) 36.15  BLOOD PRESSURE - SYSTOLIC [id number]  BLOOD PRESSURE - DIASTOLIC 79   Body Fat % [id number] %  Total Body Water (lbs) 77 lbs   ASK: We discussed the diagnosis of obesity with Melissa [name] today and Melissa [name] agreed to give [place] permission to discuss obesity behavioral modification therapy today.  ASSESS: Melissa [name] has the diagnosis of obesity and her BMI today is 36.1. Melissa [name] is in the action stage of change.   ADVISE: Mariesha was educated on the multiple health risks of obesity as well as the benefit of weight loss to improve her health. She was advised of the need for long term treatment and the importance of lifestyle modifications to improve her current health and to decrease her risk of future health problems.  AGREE: Multiple dietary modification options and treatment options were discussed and  Melissa [name] agreed to follow the recommendations documented in the above note.  ARRANGE: Jewelle was educated on the importance of frequent visits to treat obesity as outlined per CMS and USPSTF guidelines and agreed to schedule her next follow up appointment today.  I, Michaelene Song, am acting as Location manager for Dennard Nip, MD  I have reviewed the above documentation for accuracy and completeness, and I agree with the above. -Dennard Nip, MD

## 2019-10-07 ENCOUNTER — Ambulatory Visit (INDEPENDENT_AMBULATORY_CARE_PROVIDER_SITE_OTHER): Payer: 59 | Admitting: Psychology

## 2019-10-07 ENCOUNTER — Other Ambulatory Visit: Payer: Self-pay

## 2019-10-07 DIAGNOSIS — F3289 Other specified depressive episodes: Secondary | ICD-10-CM | POA: Diagnosis not present

## 2019-10-08 MED FILL — UNIFINE PENTIPS 32GX5/32: 32G X 4 MM | 50 days supply | Qty: 100 | Fill #0

## 2019-10-19 ENCOUNTER — Other Ambulatory Visit: Payer: Self-pay

## 2019-10-19 ENCOUNTER — Encounter (INDEPENDENT_AMBULATORY_CARE_PROVIDER_SITE_OTHER): Payer: Self-pay | Admitting: Family Medicine

## 2019-10-19 ENCOUNTER — Ambulatory Visit (INDEPENDENT_AMBULATORY_CARE_PROVIDER_SITE_OTHER): Payer: 59 | Admitting: Family Medicine

## 2019-10-19 VITALS — BP 111/76 | HR 78 | Temp 97.6°F | Ht 63.0 in | Wt 200.0 lb

## 2019-10-19 DIAGNOSIS — R7303 Prediabetes: Secondary | ICD-10-CM

## 2019-10-19 DIAGNOSIS — Z9189 Other specified personal risk factors, not elsewhere classified: Secondary | ICD-10-CM

## 2019-10-19 DIAGNOSIS — E559 Vitamin D deficiency, unspecified: Secondary | ICD-10-CM

## 2019-10-19 DIAGNOSIS — Z6835 Body mass index (BMI) 35.0-35.9, adult: Secondary | ICD-10-CM | POA: Diagnosis not present

## 2019-10-19 DIAGNOSIS — R7989 Other specified abnormal findings of blood chemistry: Secondary | ICD-10-CM | POA: Diagnosis not present

## 2019-10-19 DIAGNOSIS — E7849 Other hyperlipidemia: Secondary | ICD-10-CM | POA: Diagnosis not present

## 2019-10-19 DIAGNOSIS — F3289 Other specified depressive episodes: Secondary | ICD-10-CM

## 2019-10-19 MED ORDER — VITAMIN D (ERGOCALCIFEROL) 1.25 MG (50000 UNIT) PO CAPS: ORAL_CAPSULE | ORAL | 0 refills | Status: DC

## 2019-10-19 MED ORDER — METFORMIN HCL 500 MG PO TABS: 500.0000 mg | ORAL_TABLET | Freq: Two times a day (BID) | ORAL | 0 refills | Status: DC

## 2019-10-19 MED ORDER — TOPIRAMATE 50 MG PO TABS: 50.0000 mg | ORAL_TABLET | Freq: Every day | ORAL | 0 refills | Status: DC

## 2019-10-19 NOTE — Progress Notes (Signed)
Office: [phone]  /  Fax: [phone]   HPI:  Chief Complaint: OBESITY Melissa [name] is here to discuss her progress with her obesity treatment plan. She is on the Category 2 plan and states she is following her eating plan approximately 50% of the time. She states she is walking 15 minutes 3-5 times per week.  Melissa [name] continues to do very well with weight loss even during the holidays. We finally got insurance to approve Saxenda and she will be starting this soon.  Today's visit was #9 Starting weight: 219 lbs Starting date: 05/18/2019 Today's weight: 200 lbs  Today's date: 10/19/2019 Total lbs lost to date: 19 Total lbs lost since last in-office visit: 4  Prediabetes Melissa [name] has a diagnosis of prediabetes and is working on diet and exercise. She is stable on metformin. Is due to have labs checked.  At risk for diabetes Melissa [name] is at higher than average risk for developing diabetes due to her obesity.   Emotional Eating Melissa [name] is doing well on Topamax and requests a refill today.  Vitamin D deficiency Melissa [name] has a diagnosis of Vitamin D deficiency and is stable on prescription Vitamin D. She is due to have labs rechecked.  Hyperlipidemia Melissa [name] has a diagnosis of hyperlipidemia and is working on diet and weight loss. No chest pain. She is due to have labs rechecked.  Elevated LFT's Melissa [name] has mildly elevated AST of 41 and ALT of 80. She denies abdominal pain or jaundice.  ASSESSMENT AND PLAN:  Prediabetes - Plan: Insulin, random, HgB A1c, Comprehensive Metabolic Panel (CMET), metFORMIN (GLUCOPHAGE) 500 MG tablet  Other depression, emotional eating - Plan: topiramate (TOPAMAX) 50 MG tablet  Vitamin D deficiency - Plan: Vitamin D (25 hydroxy), Vitamin D, Ergocalciferol, (DRISDOL) 1.25 MG (50000 UT) CAPS capsule  Other hyperlipidemia - Plan: Lipid Panel With LDL/HDL Ratio  Elevated LFTs  At risk for diabetes mellitus  Class 2 severe obesity with serious comorbidity and body mass  index (BMI) of 35.0 to 35.9 in adult, unspecified obesity type ([place])  PLAN:  Pre-Diabetes Melissa [name] was given a refill on her metformin 500 mg BID #60 with 0 refills. She agrees to follow-up with our clinic in 4 weeks.   Diabetes risk counseling (~15 min) Melissa [name] is a 48 y.o. female and has risk factors for diabetes including obesity. We discussed intensive lifestyle modifications today with an emphasis on weight loss as well as increasing exercise and decreasing simple carbohydrates in her diet.  Emotional Eating Melissa [name] was given a refill on Topamax 50 mg #30 with 0 refills. She agrees to follow-up with our clinic in 4 weeks.  Vitamin D Deficiency Melissa [name] was informed that low Vitamin D levels contributes to fatigue and are associated with obesity, breast, and colon cancer. She agrees to continue to take prescription Vit D @ 50,000 IU every week #4 with 0 refills and will follow-up for routine testing of Vitamin D, at least 2-3 times per year. She was informed of the risk of over-replacement of Vitamin D and agrees to not increase her dose unless she discusses this with [place] first. Melissa [name] agrees to follow-up with our clinic in 4 weeks.  Hyperlipidemia Intensive lifestyle modifications as the first line treatment for hyperlipidemia. We discussed many lifestyle modifications today and Melissa [name] will continue to work on diet, exercise and weight loss efforts. She will have labs checked.  Elevated LFT's Melissa [name] will continue diet and weight loss. She will have labs checked.  Obesity Melissa [name] is currently in the action stage  of change. As such, her goal is to continue with weight loss efforts. She has agreed to follow the Category 2 plan. Melissa [name] has been instructed to work up to a goal of 150 minutes of combined cardio and strengthening exercise per week for weight loss and overall health benefits. We discussed the following Behavioral Modification Strategies today: holiday eating strategies.   Melissa [name] has agreed  to follow-up with our clinic in 4 weeks. She was informed of the importance of frequent follow-up visits to maximize her success with intensive lifestyle modifications for her multiple health conditions.  ALLERGIES: Allergies  Allergen Reactions  . Pumpkin Seed Itching    Itching mouth, wheezing    MEDICATIONS: Current Outpatient Medications on File Prior to Visit  Medication Sig Dispense Refill  . desvenlafaxine (PRISTIQ) 50 MG 24 hr tablet Take 50 mg by mouth daily.    . Insulin Pen Needle (BD PEN NEEDLE NANO 2ND GEN) 32G X 4 MM MISC 1 Package by Does not apply route 2 (two) times daily. 100 each 0  . Liraglutide -Weight Management (SAXENDA) 18 MG/3ML SOPN Inject 3 mg into the skin daily. 5 pen 0  . Multiple Vitamins-Minerals (MULTIVITAMIN WITH MINERALS) tablet Take 1 tablet by mouth daily.    . Omega-3 Fatty Acids (FISH OIL) 1200 MG CAPS Take 1 capsule by mouth daily.     No current facility-administered medications on file prior to visit.    PAST MEDICAL HISTORY: Past Medical History:  Diagnosis Date  . ADD (attention deficit disorder)   . Back pain   . Depression   . Dyspnea   . Heel pain   . Joint pain   . Leg pain   . Lower extremity edema   . Vitamin D deficiency     PAST SURGICAL HISTORY: Past Surgical History:  Procedure Laterality Date  . NOVASURE ABLATION    . plastic surgery arm      SOCIAL HISTORY: Social History   Tobacco Use  . Smoking status: Never Smoker  . Smokeless tobacco: Never Used  Substance Use Topics  . Alcohol use: Not on file  . Drug use: Not on file    FAMILY HISTORY: Family History  Problem Relation Age of Onset  . Diabetes Mother   . Hypertension Mother   . Hyperlipidemia Mother   . Stroke Mother   . Sudden death Mother   . Eating disorder Mother   . Obesity Mother   . Diabetes Father   . Hyperlipidemia Father   . Hypertension Father   . Cancer Father   . Eating disorder Father   . Obesity Father    ROS: Review of  Systems  Constitutional: Positive for weight loss (4 lbs).  Cardiovascular: Negative for chest pain.  Gastrointestinal: Negative for abdominal pain.  Skin:       Negative for jaundice.   PHYSICAL EXAM: Blood pressure 111/76, pulse 78, temperature 97.6 F (36.4 C), temperature source Oral, height 5\' 3"  (1.6 m), weight 200 lb (90.7 kg), SpO2 98 %. Body mass index is 35.43 kg/m. Physical Exam Vitals reviewed.  Constitutional:      Appearance: Normal appearance. She is obese.  Cardiovascular:     Rate and Rhythm: Normal rate.     Pulses: Normal pulses.  Pulmonary:     Effort: Pulmonary effort is normal.     Breath sounds: Normal breath sounds.  Musculoskeletal:        General: Normal range of motion.  Skin:    General: Skin  is warm and dry.  Neurological:     Mental Status: She is alert and oriented to person, place, and time.  Psychiatric:        Behavior: Behavior normal.   RECENT LABS AND TESTS: BMET    Component Value Date/Time   NA 141 05/18/2019 1114   K 4.5 05/18/2019 1114   CL 101 05/18/2019 1114   CO2 19 (L) 05/18/2019 1114   GLUCOSE 108 (H) 05/18/2019 1114   BUN 13 05/18/2019 1114   CREATININE 0.72 05/18/2019 1114   CALCIUM 8.9 05/18/2019 1114   GFRNONAA 98 05/18/2019 1114   GFRAA 113 05/18/2019 1114   Lab Results  Component Value Date   HGBA1C 6.1 (H) 05/18/2019   Lab Results  Component Value Date   INSULIN 15.1 05/18/2019   CBC    Component Value Date/Time   WBC 7.8 05/18/2019 1114   RBC 5.06 05/18/2019 1114   HGB 15.6 05/18/2019 1114   HCT 46.5 05/18/2019 1114   MCV 92 05/18/2019 1114   MCH 30.8 05/18/2019 1114   MCHC 33.5 05/18/2019 1114   RDW 13.2 05/18/2019 1114   LYMPHSABS 2.2 05/18/2019 1114   EOSABS 0.1 05/18/2019 1114   BASOSABS 0.1 05/18/2019 1114   Iron/TIBC/Ferritin/ %Sat No results found for: IRON, TIBC, FERRITIN, IRONPCTSAT Lipid Panel     Component Value Date/Time   CHOL 191 05/18/2019 1114   TRIG 339 (H) 05/18/2019 1114    HDL 36 (L) 05/18/2019 1114   LDLCALC 87 05/18/2019 1114   Hepatic Function Panel     Component Value Date/Time   PROT 6.8 05/18/2019 1114   ALBUMIN 4.3 05/18/2019 1114   AST 41 (H) 05/18/2019 1114   ALT 80 (H) 05/18/2019 1114   ALKPHOS 85 05/18/2019 1114   BILITOT 0.5 05/18/2019 1114      Component Value Date/Time   TSH 2.530 05/18/2019 1114      I, Michaelene Song, am acting as Location manager for Dennard Nip, MD I have reviewed the above documentation for accuracy and completeness, and I agree with the above. -Dennard Nip, MD

## 2019-10-20 LAB — LIPID PANEL WITH LDL/HDL RATIO
Cholesterol, Total: 179 mg/dL (ref 100–199)
HDL: 37 mg/dL — ABNORMAL LOW (ref >39)
LDL Chol Calc (NIH): 104 mg/dL — ABNORMAL HIGH (ref 0–99)
LDL/HDL Ratio: 2.8 ratio (ref 0.0–3.2)
Triglycerides: 219 mg/dL — ABNORMAL HIGH (ref 0–149)
VLDL Cholesterol Cal: 38 mg/dL (ref 5–40)

## 2019-10-20 LAB — COMPREHENSIVE METABOLIC PANEL
ALT: 30 IU/L (ref 0–32)
AST: 18 IU/L (ref 0–40)
Albumin/Globulin Ratio: 1.7 (ref 1.2–2.2)
Albumin: 4.4 g/dL (ref 3.8–4.8)
Alkaline Phosphatase: 91 IU/L (ref 39–117)
BUN/Creatinine Ratio: 26 — ABNORMAL HIGH (ref 9–23)
BUN: 19 mg/dL (ref 6–24)
Bilirubin Total: 0.5 mg/dL (ref 0.0–1.2)
CO2: 21 mmol/L (ref 20–29)
Calcium: 10 mg/dL (ref 8.7–10.2)
Chloride: 103 mmol/L (ref 96–106)
Creatinine, Ser: 0.72 mg/dL (ref 0.57–1.00)
GFR calc Af Amer: 113 mL/min/{1.73_m2} (ref >59)
GFR calc non Af Amer: 98 mL/min/{1.73_m2} (ref >59)
Globulin, Total: 2.6 g/dL (ref 1.5–4.5)
Glucose: 92 mg/dL (ref 65–99)
Potassium: 4.6 mmol/L (ref 3.5–5.2)
Sodium: 138 mmol/L (ref 134–144)
Total Protein: 7 g/dL (ref 6.0–8.5)

## 2019-10-20 LAB — HEMOGLOBIN A1C
Est. average glucose Bld gHb Est-mCnc: 114 mg/dL
Hgb A1c MFr Bld: 5.6 % (ref 4.8–5.6)

## 2019-10-20 LAB — INSULIN, RANDOM: INSULIN: 13.3 u[IU]/mL (ref 2.6–24.9)

## 2019-10-20 LAB — VITAMIN D 25 HYDROXY (VIT D DEFICIENCY, FRACTURES): Vit D, 25-Hydroxy: 32.2 ng/mL (ref 30.0–100.0)

## 2019-10-22 MED FILL — SAXENDA 18 MG/3 ML PEN: 18 | 30 days supply | Qty: 15 | Fill #0

## 2019-10-22 MED FILL — metFORMIN HCL 500 MG TABS: 500 | 30 days supply | Qty: 60 | Fill #0

## 2019-10-25 NOTE — Progress Notes (Signed)
Office: [phone]  /  Fax: [phone]    Date: November 08, 2019   Appointment Start Time: 2:27pm Duration: 31 minutes Provider: Glennie Isle, Psy.D. Type of Session: Individual Therapy  Location of Patient: Home Location of Provider: [facility] Type of Contact: Telepsychological Visit via [employer] WebEx  Session Content: Melissa [name] is a 48 y.o. female presenting via [place] for a follow-up appointment to address the previously established treatment goal of decreasing emotional eating. Today's appointment was a telepsychological visit due to COVID-19. Melissa [name] provided verbal consent for today's telepsychological appointment and she is aware she is responsible for securing confidentiality on her end of the session. Prior to proceeding with today's appointment, Melissa [name]'s physical location at the time of this appointment was obtained as well a phone number she could be reached at in the event of technical difficulties. Kaitrin and this provider participated in today's telepsychological service.   This provider conducted a brief check-in and verbally administered the PHQ-9 and GAD-7. Melissa [name] shared about recent events, including holidays and celebrations. She stated eating has been going "pretty good." She acknowledged enjoying herself and described engaging in portion control and making better choices. Melissa [name] continues to report a reduction in emotional eating, but acknowledged engaging in emotional eating recently secondary to feeling "run down." Regarding pleasurable activities, Melissa [name] reported, "It's going pretty good." She noted a plan to take free online classes. Positive reinforcement was provided. Remainder of today's appointment focused on mindfulness. She was led through an exercise (A Taste of Mindfulness). Her experience was processed. Melissa [name] provided verbal consent during today's appointment for this provider to send the handout for today's exercise via e-mail. Melissa [name] was  receptive to today's appointment as evidenced by openness to sharing, responsiveness to feedback, and willingness to engage in learned skills. Mental Status Examination:  Appearance: well groomed and appropriate hygiene  Behavior: appropriate to circumstances Mood: euthymic Affect: mood congruent Speech: normal in rate, volume, and tone Eye Contact: appropriate Psychomotor Activity: appropriate Gait: unable to assess Thought Process: linear, logical, and goal directed  Thought Content/Perception: no hallucinations, delusions, bizarre thinking or behavior reported or observed and no evidence of suicidal and homicidal ideation, plan, and intent Orientation: time, person, place and purpose of appointment Memory/Concentration: memory, attention, language, and fund of knowledge intact  Insight/Judgment: good  Structured Assessments Results: The Patient Health Questionnaire-9 (PHQ-9) is a self-report measure that assesses symptoms and severity of depression over the course of the last two weeks. Favour obtained a score of 2 suggesting mild depression. Melissa [name] finds the endorsed symptoms to be somewhat difficult. [0= Not at all; 1= Several days; 2= More than half the days; 3= Nearly every day] Little interest or pleasure in doing things 0  Feeling down, depressed, or hopeless 0  Trouble falling or staying asleep, or sleeping too much 0  Feeling tired or having little energy 1  Poor appetite or overeating 0  Feeling bad about yourself --- or that you are a failure or have let yourself or your family down 0  Trouble concentrating on things, such as reading the newspaper or watching television 1  Moving or speaking so slowly that other people could have noticed? Or the opposite --- being so fidgety or restless that you have been moving around a lot more than usual 0  Thoughts that you would be better off dead or hurting yourself in some way 0  PHQ-9 Score 2    The Generalized Anxiety Disorder-7  (GAD-7) is a brief self-report  measure that assesses symptoms of anxiety over the course of the last two weeks. Melissa [name] obtained a score of 0. [0= Not at all; 1= Several days; 2= Over half the days; 3= Nearly every day] Feeling nervous, anxious, on edge 0  Not being able to stop or control worrying 0  Worrying too much about different things 0  Trouble relaxing 0  Being so restless that it's hard to sit still 0  Becoming easily annoyed or irritable 0  Feeling afraid as if something awful might happen 0  GAD-7 Score 0   Interventions:  Conducted a brief chart review Verbally administered PHQ-9 and GAD-7 for symptom monitoring Provided empathic reflections and validation Reviewed content from the previous session Employed supportive psychotherapy interventions to facilitate reduced distress and to improve coping skills with identified stressors Employed motivational interviewing skills to assess patient's willingness/desire to adhere to recommended medical treatments and assignments Engaged patient in mindfulness exercise(s)  DSM-5 Diagnosis: 311 (F32.8) Other Specified Depressive Disorder, Emotional Eating Behaviors  Treatment Goal & Progress: During the initial appointment with this provider, the following treatment goal was established: decrease emotional eating. Melissa [name] has demonstrated progress in her goal as evidenced by increased awareness of hunger patterns, increased awareness of triggers for emotional eating and reduction in emotional eating. Melissa [name] also continues to demonstrate willingness to engage in learned skill(s).  Plan: The next appointment will be scheduled in one month, which will be via [employer]. The next session will focus on reviewing learned skills and termination.

## 2019-11-08 ENCOUNTER — Other Ambulatory Visit: Payer: Self-pay

## 2019-11-08 ENCOUNTER — Ambulatory Visit (INDEPENDENT_AMBULATORY_CARE_PROVIDER_SITE_OTHER): Payer: 59 | Admitting: Psychology

## 2019-11-08 DIAGNOSIS — F3289 Other specified depressive episodes: Secondary | ICD-10-CM | POA: Diagnosis not present

## 2019-11-08 MED FILL — VIT D2 1.25 MG (50,000 UNIT: 1.25 MG | 28 days supply | Qty: 4 | Fill #0

## 2019-11-09 MED FILL — DESVENLAFAXINE SUC ER 50 MG: 50 | 90 days supply | Qty: 90 | Fill #0

## 2019-11-11 ENCOUNTER — Encounter (INDEPENDENT_AMBULATORY_CARE_PROVIDER_SITE_OTHER): Payer: Self-pay

## 2019-11-16 ENCOUNTER — Telehealth (INDEPENDENT_AMBULATORY_CARE_PROVIDER_SITE_OTHER): Payer: 59 | Admitting: Family Medicine

## 2019-11-16 ENCOUNTER — Encounter (INDEPENDENT_AMBULATORY_CARE_PROVIDER_SITE_OTHER): Payer: Self-pay | Admitting: Family Medicine

## 2019-11-16 ENCOUNTER — Other Ambulatory Visit: Payer: Self-pay

## 2019-11-16 DIAGNOSIS — R7303 Prediabetes: Secondary | ICD-10-CM

## 2019-11-16 DIAGNOSIS — F3289 Other specified depressive episodes: Secondary | ICD-10-CM

## 2019-11-16 DIAGNOSIS — Z6835 Body mass index (BMI) 35.0-35.9, adult: Secondary | ICD-10-CM

## 2019-11-16 MED ORDER — METFORMIN HCL 500 MG PO TABS: 500.0000 mg | ORAL_TABLET | Freq: Two times a day (BID) | ORAL | 0 refills | Status: DC

## 2019-11-16 MED ORDER — TOPIRAMATE 50 MG PO TABS: 50.0000 mg | ORAL_TABLET | Freq: Every day | ORAL | 0 refills | Status: DC

## 2019-11-16 MED FILL — TOPIRAMATE 50 MG TABLET: 50 | 30 days supply | Qty: 30 | Fill #0

## 2019-11-16 MED FILL — metFORMIN HCL 500 MG TABS: 500 | 30 days supply | Qty: 60 | Fill #0

## 2019-11-16 NOTE — Progress Notes (Signed)
TeleHealth Visit:  Due to the COVID-19 pandemic, this visit was completed with telemedicine (audio/video) technology to reduce patient and provider exposure as well as to preserve personal protective equipment.   Melissa [name] has verbally consented to this TeleHealth visit. The patient is located at home, the provider is located at the [employer] [facility] office. The participants in this visit include the listed provider and patient. The visit was conducted today via Webex.  Chief Complaint: OBESITY Melissa [name] is here to discuss her progress with her obesity treatment plan along with follow-up of her obesity related diagnoses. Melissa [name] is on the Category 2 Plan and states she is following her eating plan approximately 0% of the time. Melissa [name] states she is walking 30 minutes 3 times per week.  Today's visit was #: 10 Starting weight: 219 lbs Starting date: 05/18/19  Interim History: Melissa [name] reports being off the plan over the holidays. She allowed herself treats for her recent wedding anniversary. She is starting back on a plan today. She started saxenda 2 weeks ago. She is on 1.2 mg subq daily. She reports mild occasional nausea. She struggles to get in vegetables on the plan.    Subjective:   1. Prediabetes Melissa [name] has a diagnosis of prediabetes based on her elevated HgA1c which was improved from 6.1 to 5.6 (10/19/19). Her fasting insulin also decreased.  She continues to work on diet and exercise to decrease her risk of diabetes. She is currently taking Metformin BID and tolerates well. She denies nausea or hypoglycemia.  2. Other depression, emotional eating Melissa [name] is struggling with emotional eating and using food for comfort to the extent that it is negatively impacting her health. She often snacks when she is not hungry. Melissa [name] sometimes feels she is out of control and then feels guilty that she made poor food choices. She has been working on behavior modification techniques to help  reduce her emotional eating and has been somewhat successful. Her mood is stable. She reports increase stress from recent political events. She reports dysgeusia but denies paresthesias.  She shows no sign of suicidal or homicidal ideations.   Assessment/Plan:   1. Prediabetes [name] will continue to work on weight loss, exercise, and decreasing simple carbohydrates to help decrease the risk of diabetes.  - metFORMIN (GLUCOPHAGE) 500 MG tablet; Take 1 tablet (500 mg total) by mouth 2 (two) times daily with a meal.  Dispense: 60 tablet; Refill: 0  2. Other depression, emotional eating Behavior modification techniques were discussed today to help Melissa [name] deal with her emotional/non-hunger eating behaviors.  Orders and follow up as documented in patient record.  - topiramate (TOPAMAX) 50 MG tablet; Take 1 tablet (50 mg total) by mouth daily.  Dispense: 30 tablet; Refill: 0  Class 2 obesity with serious comorbidity and BMI of 35.0 to 35.9 in adult, unspecified obesity type  Melissa [name] is currently in the action stage of change. As such, her goal is to continue with weight loss efforts. She has agreed to on the Category 2 Plan. She agrees to continue Saxenda 1.2 mg daily.   We discussed the following exercise goals today: As above. Increase exercise to improve HDL.   We discussed the following behavioral modification strategies today: increasing lean protein intake, decreasing simple carbohydrates, increasing vegetables and planning for success.  Melissa [name] has agreed to follow-up with our clinic in 3 weeks. She was informed of the importance of frequent follow-up visits to maximize her success with intensive lifestyle modifications for  her multiple health conditions.   Objective:   VITALS: Per patient if applicable, see vitals. GENERAL: Alert and in no acute distress. CARDIOPULMONARY: No increased WOB. Speaking in clear sentences.  PSYCH: Pleasant and cooperative. Speech normal rate and rhythm.  Affect is appropriate. Insight and judgement are appropriate. Attention is focused, linear, and appropriate.  NEURO: Oriented as arrived to appointment on time with no prompting.   Lab Results  Component Value Date   CREATININE 0.72 10/19/2019   BUN 19 10/19/2019   NA 138 10/19/2019   K 4.6 10/19/2019   CL 103 10/19/2019   CO2 21 10/19/2019   Lab Results  Component Value Date   ALT 30 10/19/2019   AST 18 10/19/2019   ALKPHOS 91 10/19/2019   BILITOT 0.5 10/19/2019   Lab Results  Component Value Date   HGBA1C 5.6 10/19/2019   HGBA1C 6.1 (H) 05/18/2019   Lab Results  Component Value Date   INSULIN 13.3 10/19/2019   INSULIN 15.1 05/18/2019   Lab Results  Component Value Date   TSH 2.530 05/18/2019   Lab Results  Component Value Date   CHOL 179 10/19/2019   HDL 37 (L) 10/19/2019   LDLCALC 104 (H) 10/19/2019   TRIG 219 (H) 10/19/2019   Lab Results  Component Value Date   WBC 7.8 05/18/2019   HGB 15.6 05/18/2019   HCT 46.5 05/18/2019   MCV 92 05/18/2019    Attestation Statements:   Reviewed by clinician on day of visit: allergies, medications, problem list, medical history, surgical history, family history, social history, and previous encounter notes.  I, Renee Ramus, am acting as Location manager for [employer], [place].  I have reviewed the above documentation for accuracy and completeness, and I agree with the above. - Georgianne Fick, FNP

## 2019-11-22 ENCOUNTER — Other Ambulatory Visit (INDEPENDENT_AMBULATORY_CARE_PROVIDER_SITE_OTHER): Payer: Self-pay | Admitting: Family Medicine

## 2019-11-22 DIAGNOSIS — F3289 Other specified depressive episodes: Secondary | ICD-10-CM

## 2019-11-22 NOTE — Progress Notes (Signed)
  Office: [phone]  /  Fax: [phone]    Date: December 06, 2019   Appointment Start Time: 2:31pm Duration: 25 minutes Provider: Glennie Isle, Psy.D. Type of Session: Individual Therapy  Location of Patient: Home Location of Provider: Provider's Home Type of Contact: Telepsychological Visit via [employer] WebEx  Session Content: Melissa [name] is a 49 y.o. female presenting via [place] for a follow-up appointment to address the previously established treatment goal of decreasing emotional eating. Today's appointment was a telepsychological visit due to COVID-19. Jess provided verbal consent for today's telepsychological appointment and she is aware she is responsible for securing confidentiality on her end of the session. Prior to proceeding with today's appointment, [name]'s physical location at the time of this appointment was obtained as well a phone number she could be reached at in the event of technical difficulties. [name] and this provider participated in today's telepsychological service.   This provider conducted a brief check-in. [name] shared about recent events. Regarding eating, she noted, "It's been going pretty good." She discussed deviations from the meal plan for dinner, but discussed engaging in portion control. She continues to report a reduction in emotional eating. A plan was developed for the future to help [name] cope with emotional eating using learned skills. She wrote down the following plan: focus on hydration, be prepared with snacks congruent to the meal plan, pause to ask questions when triggered to eat (e.g., Am I really hungry?; Is there something bothering me? Will I feel better if I eat?), and engage in pleasurable activities and/or other coping skills after going through the aforementioned questions. Overall, [name] was receptive to today's appointment as evidenced by openness to sharing, responsiveness to feedback, and willingness to engage in learned skills.  Mental  Status Examination:  Appearance: well groomed and appropriate hygiene  Behavior: appropriate to circumstances Mood: euthymic Affect: mood congruent Speech: normal in rate, volume, and tone Eye Contact: appropriate Psychomotor Activity: appropriate Gait: unable to assess Thought Process: linear, logical, and goal directed  Thought Content/Perception: no hallucinations, delusions, bizarre thinking or behavior reported or observed and no evidence of suicidal and homicidal ideation, plan, and intent Orientation: time, person, place and purpose of appointment Memory/Concentration: memory, attention, language, and fund of knowledge intact  Insight/Judgment: good  Interventions:  Conducted a brief chart review Provided empathic reflections and validation Employed supportive psychotherapy interventions to facilitate reduced distress and to improve coping skills with identified stressors Employed motivational interviewing skills to assess patient's willingness/desire to adhere to recommended medical treatments and assignments Reviewed learned skills  DSM-5 Diagnosis: 311 (F32.8) Other Specified Depressive Disorder, Emotional Eating Behaviors  Treatment Goal & Progress: During the initial appointment with this provider, the following treatment goal was established: decrease emotional eating. [name] demonstrated progress in her goal as evidenced by increased awareness of hunger patterns, increased awareness of triggers for emotional eating and reduction in emotional eating. [name] also continues to demonstrate willingness to engage in learned skill(s).  Plan: As planned, today was [name]'s last appointment with this provider. She acknowledged understanding that she may request a follow-up appointment with this provider in the future as long as she is still established with the clinic. No further follow-up planned by this provider.

## 2019-12-06 ENCOUNTER — Ambulatory Visit (INDEPENDENT_AMBULATORY_CARE_PROVIDER_SITE_OTHER): Payer: 59 | Admitting: Psychology

## 2019-12-06 ENCOUNTER — Other Ambulatory Visit: Payer: Self-pay

## 2019-12-06 DIAGNOSIS — F3289 Other specified depressive episodes: Secondary | ICD-10-CM

## 2019-12-08 ENCOUNTER — Ambulatory Visit (INDEPENDENT_AMBULATORY_CARE_PROVIDER_SITE_OTHER): Payer: Self-pay | Admitting: Family Medicine

## 2019-12-09 ENCOUNTER — Other Ambulatory Visit (INDEPENDENT_AMBULATORY_CARE_PROVIDER_SITE_OTHER): Payer: Self-pay | Admitting: Family Medicine

## 2019-12-09 DIAGNOSIS — E559 Vitamin D deficiency, unspecified: Secondary | ICD-10-CM

## 2019-12-09 MED FILL — VIT D2 1.25 MG (50,000 UNIT: 1.25 MG | 28 days supply | Qty: 4 | Fill #0

## 2019-12-17 ENCOUNTER — Other Ambulatory Visit (INDEPENDENT_AMBULATORY_CARE_PROVIDER_SITE_OTHER): Payer: Self-pay | Admitting: Family Medicine

## 2019-12-20 MED FILL — SAXENDA 18 MG/3 ML PEN: 18 | 30 days supply | Qty: 15 | Fill #0

## 2019-12-27 ENCOUNTER — Ambulatory Visit (INDEPENDENT_AMBULATORY_CARE_PROVIDER_SITE_OTHER): Payer: 59 | Admitting: Family Medicine

## 2019-12-27 ENCOUNTER — Encounter (INDEPENDENT_AMBULATORY_CARE_PROVIDER_SITE_OTHER): Payer: Self-pay | Admitting: Family Medicine

## 2019-12-27 ENCOUNTER — Other Ambulatory Visit: Payer: Self-pay

## 2019-12-27 VITALS — BP 114/74 | HR 82 | Temp 97.6°F | Ht 63.0 in | Wt 199.0 lb

## 2019-12-27 DIAGNOSIS — Z9189 Other specified personal risk factors, not elsewhere classified: Secondary | ICD-10-CM

## 2019-12-27 DIAGNOSIS — Z6835 Body mass index (BMI) 35.0-35.9, adult: Secondary | ICD-10-CM

## 2019-12-27 DIAGNOSIS — E8881 Metabolic syndrome: Secondary | ICD-10-CM

## 2019-12-27 DIAGNOSIS — F3289 Other specified depressive episodes: Secondary | ICD-10-CM | POA: Diagnosis not present

## 2019-12-27 MED ORDER — TOPIRAMATE 50 MG PO TABS: 50.0000 mg | ORAL_TABLET | Freq: Every day | ORAL | 0 refills | Status: DC

## 2019-12-27 MED FILL — TOPIRAMATE 50 MG TABLET: 50 | 30 days supply | Qty: 30 | Fill #0

## 2019-12-27 NOTE — Progress Notes (Addendum)
Chief Complaint:   OBESITY Melissa [name] is here to discuss her progress with her obesity treatment plan along with follow-up of her obesity related diagnoses. Melissa [name] is on the Category 2 Plan and states she is following her eating plan approximately 50% of the time. Melissa [name] states she is exercising for 0 minutes 0 times per week.  Today's visit was #: 11 Starting weight: 219 lbs Starting date: 05/18/2019 Today's weight: 199 lbs Today's date: 12/27/2019 Total lbs lost to date: 20 lbs Total lbs lost since last in-office visit: 1 lb  Interim History: Melissa [name] has lost 10% of her total body weight and is now below 200 pounds.  She is making new meal routines with her family but has decreased activity due to the weather.  She notes that she gets hungry around 1 pm.  Subjective:   1. Insulin resistance Melissa [name] has a diagnosis of insulin resistance based on her elevated fasting insulin level >5. She continues to work on diet and exercise to decrease her risk of diabetes.  Patient notes increase in polyphagia.  She is doing well with diet and weight loss.  Lab Results  Component Value Date   INSULIN 13.3 10/19/2019   INSULIN 15.1 05/18/2019   Lab Results  Component Value Date   HGBA1C 5.6 10/19/2019   2. Other depression, emotional eating Melissa [name] is struggling with emotional eating and using food for comfort to the extent that it is negatively impacting her health. She has been working on behavior modification techniques to help reduce her emotional eating and has been somewhat successful. She shows no sign of suicidal or homicidal ideations.  She is still taking Topamax.  No notable side effects.  3. At risk for side effect of medication Melissa [name] is at risk for medication side effect because of increase in Saxenda dose.  Assessment/Plan:   1. Insulin resistance Melissa [name] will continue to work on weight loss, exercise, and decreasing simple carbohydrates to help decrease the risk of diabetes. Melissa [name]  agreed to follow-up with [place] as directed to closely monitor her progress.  She will increase Saxenda to 1.8 mg per day.  2. Other depression, emotional eating Behavior modification techniques were discussed today to help Melissa [name] deal with her emotional/non-hunger eating behaviors.  Orders and follow up as documented in patient record.   - topiramate (TOPAMAX) 50 MG tablet; Take 1 tablet (50 mg total) by mouth daily.  Dispense: 30 tablet; Refill: 0  3. At risk for side effect of medication Melissa [name] was given approximately 15 minutes of drug side effect counseling today.  We discussed side effect possibility and risk versus benefits. Melissa [name] agreed to the medication and will contact this office if these side effects are intolerable.  Repetitive spaced learning was employed today to elicit superior memory formation and behavioral change.  4. Class 2 severe obesity with serious comorbidity and body mass index (BMI) of 35.0 to 35.9 in adult, unspecified obesity type (HCC) Melissa [name] is currently in the action stage of change. As such, her goal is to continue with weight loss efforts. She has agreed to the Category 2 Plan.   Behavioral modification strategies: increasing lean protein intake.  Melissa [name] has agreed to follow-up with our clinic in 4 weeks. She was informed of the importance of frequent follow-up visits to maximize her success with intensive lifestyle modifications for her multiple health conditions.   Objective:   Blood pressure 114/74, pulse 82, temperature 97.6 F (36.4 C), temperature source Oral, height 5\' 3"  (1.6 m),  weight 199 lb (90.3 kg), SpO2 97 %. Body mass index is 35.25 kg/m.  General: Cooperative, alert, well developed, in no acute distress. HEENT: Conjunctivae and lids unremarkable. Cardiovascular: Regular rhythm.  Lungs: Normal work of breathing. Neurologic: No focal deficits.   Lab Results  Component Value Date   CREATININE 0.72 10/19/2019   BUN 19 10/19/2019   NA 138  10/19/2019   K 4.6 10/19/2019   CL 103 10/19/2019   CO2 21 10/19/2019   Lab Results  Component Value Date   ALT 30 10/19/2019   AST 18 10/19/2019   ALKPHOS 91 10/19/2019   BILITOT 0.5 10/19/2019   Lab Results  Component Value Date   HGBA1C 5.6 10/19/2019   HGBA1C 6.1 (H) 05/18/2019   Lab Results  Component Value Date   INSULIN 13.3 10/19/2019   INSULIN 15.1 05/18/2019   Lab Results  Component Value Date   TSH 2.530 05/18/2019   Lab Results  Component Value Date   CHOL 179 10/19/2019   HDL 37 (L) 10/19/2019   LDLCALC 104 (H) 10/19/2019   TRIG 219 (H) 10/19/2019   Lab Results  Component Value Date   WBC 7.8 05/18/2019   HGB 15.6 05/18/2019   HCT 46.5 05/18/2019   MCV 92 05/18/2019   Attestation Statements:   Reviewed by clinician on day of visit: allergies, medications, problem list, medical history, surgical history, family history, social history, and previous encounter notes.  I, Water quality scientist, CMA, am acting as transcriptionist for Dennard Nip, MD.  I have reviewed the above documentation for accuracy and completeness, and I agree with the above. Dennard Nip, MD

## 2019-12-28 DIAGNOSIS — F900 Attention-deficit hyperactivity disorder, predominantly inattentive type: Secondary | ICD-10-CM | POA: Diagnosis not present

## 2019-12-28 DIAGNOSIS — F3341 Major depressive disorder, recurrent, in partial remission: Secondary | ICD-10-CM | POA: Diagnosis not present

## 2020-01-03 ENCOUNTER — Other Ambulatory Visit (INDEPENDENT_AMBULATORY_CARE_PROVIDER_SITE_OTHER): Payer: Self-pay | Admitting: Family Medicine

## 2020-01-03 DIAGNOSIS — E559 Vitamin D deficiency, unspecified: Secondary | ICD-10-CM

## 2020-01-03 MED FILL — VIT D2 1.25 MG (50,000 UNIT: 1.25 MG | 28 days supply | Qty: 4 | Fill #0

## 2020-01-24 ENCOUNTER — Other Ambulatory Visit: Payer: Self-pay

## 2020-01-24 ENCOUNTER — Encounter (INDEPENDENT_AMBULATORY_CARE_PROVIDER_SITE_OTHER): Payer: Self-pay | Admitting: Family Medicine

## 2020-01-24 ENCOUNTER — Ambulatory Visit (INDEPENDENT_AMBULATORY_CARE_PROVIDER_SITE_OTHER): Payer: 59 | Admitting: Family Medicine

## 2020-01-24 VITALS — BP 117/81 | HR 79 | Temp 97.8°F | Ht 63.0 in | Wt 197.0 lb

## 2020-01-24 DIAGNOSIS — E669 Obesity, unspecified: Secondary | ICD-10-CM | POA: Diagnosis not present

## 2020-01-24 DIAGNOSIS — E559 Vitamin D deficiency, unspecified: Secondary | ICD-10-CM | POA: Diagnosis not present

## 2020-01-24 DIAGNOSIS — F3289 Other specified depressive episodes: Secondary | ICD-10-CM | POA: Diagnosis not present

## 2020-01-24 DIAGNOSIS — R7303 Prediabetes: Secondary | ICD-10-CM

## 2020-01-24 DIAGNOSIS — Z6834 Body mass index (BMI) 34.0-34.9, adult: Secondary | ICD-10-CM | POA: Diagnosis not present

## 2020-01-24 MED ORDER — VITAMIN D (ERGOCALCIFEROL) 1.25 MG (50000 UNIT) PO CAPS: ORAL_CAPSULE | ORAL | 0 refills | Status: DC

## 2020-01-24 MED ORDER — TOPIRAMATE 50 MG PO TABS: 50.0000 mg | ORAL_TABLET | Freq: Every day | ORAL | 0 refills | Status: DC

## 2020-01-24 MED ORDER — METFORMIN HCL 500 MG PO TABS: 500.0000 mg | ORAL_TABLET | Freq: Two times a day (BID) | ORAL | 0 refills | Status: DC

## 2020-01-24 MED FILL — metFORMIN HCL 500 MG TABS: 500 | 30 days supply | Qty: 60 | Fill #0

## 2020-01-24 MED FILL — TOPIRAMATE 50 MG TABLET: 50 | 30 days supply | Qty: 30 | Fill #0

## 2020-01-24 NOTE — Progress Notes (Signed)
Chief Complaint:   OBESITY Melissa [name] is here to discuss her progress with her obesity treatment plan along with follow-up of her obesity related diagnoses. Melissa [name] is on the Category 2 Plan and states she is following her eating plan approximately 50-60% of the time. Melissa [name] states she is walking for 20 minutes 7 times per week.  Today's visit was #: 12 Starting weight: 219 lbs Starting date: 05/18/2019 Today's weight: 197 lbs Today's date: 01/24/2020 Total lbs lost to date: 22 Total lbs lost since last in-office visit: 2  Interim History: Melissa [name] reports increase in "sweet cravings" the last 2 weeks, most noticeably late afternoon and early evenings. We discussed higher protein snack options to help lower cravings.  Subjective:   1. Pre-diabetes Melissa [name]'s last A1c was 5.6, and she is currently on liraglutide. She is on metformin 500 mg BID and she is tolerating it well, and denies GI upset. I recommended high protein snack in the late afternoon.  2. Vitamin D deficiency Melissa [name] is currently on prescription Vit D supplementation. Last Vit D level was 32.2 on 10/19/2019.  3. Other depression, emotional eating Melissa [name] notes recent increase in emotional eating in the last couple of weeks. She is currently on topiramate and she denies paresthesias.   Assessment/Plan:   1. Pre-diabetes Reid will continue to work on weight loss, exercise, and decreasing simple carbohydrates to help decrease the risk of diabetes. We will refill metformin for 1 month.   - metFORMIN (GLUCOPHAGE) 500 MG tablet; Take 1 tablet (500 mg total) by mouth 2 (two) times daily with a meal.  Dispense: 60 tablet; Refill: 0  2. Vitamin D deficiency Low Vitamin D level contributes to fatigue and are associated with obesity, breast, and colon cancer. We will refill prescription Vitamin D for 1 month. Melissa [name] will follow-up for routine testing of Vitamin D, at least 2-3 times per year to avoid over-replacement.  - Vitamin D,  Ergocalciferol, (DRISDOL) 1.25 MG (50000 UNIT) CAPS capsule; 1 capsule oral per week  Dispense: 4 capsule; Refill: 0  3. Other depression, emotional eating Behavior modification techniques were discussed today to help Melissa [name] deal with her emotional/non-hunger eating behaviors. We will refill topiramate for 1 month. Orders and follow up as documented in patient record.   - topiramate (TOPAMAX) 50 MG tablet; Take 1 tablet (50 mg total) by mouth daily.  Dispense: 30 tablet; Refill: 0  4. Class 1 obesity with serious comorbidity and body mass index (BMI) of 34.0 to 34.9 in adult, unspecified obesity type Melissa [name] is currently in the action stage of change. As such, her goal is to continue with weight loss efforts. She has agreed to the Category 2 Plan.   Exercise goals: As is.  Behavioral modification strategies: increasing lean protein intake, decreasing simple carbohydrates and emotional eating strategies.  Melissa [name] has agreed to follow-up with our clinic in 3 to 4 weeks. She was informed of the importance of frequent follow-up visits to maximize her success with intensive lifestyle modifications for her multiple health conditions.   Objective:   Blood pressure 117/81, pulse 79, temperature 97.8 F (36.6 C), temperature source Oral, height 5\' 3"  (1.6 m), weight 197 lb (89.4 kg), SpO2 98 %. Body mass index is 34.9 kg/m.  General: Cooperative, alert, well developed, in no acute distress. HEENT: Conjunctivae and lids unremarkable. Cardiovascular: Regular rhythm.  Lungs: Normal work of breathing. Neurologic: No focal deficits.   Lab Results  Component Value Date   CREATININE 0.72 10/19/2019   BUN  19 10/19/2019   NA 138 10/19/2019   K 4.6 10/19/2019   CL 103 10/19/2019   CO2 21 10/19/2019   Lab Results  Component Value Date   ALT 30 10/19/2019   AST 18 10/19/2019   ALKPHOS 91 10/19/2019   BILITOT 0.5 10/19/2019   Lab Results  Component Value Date   HGBA1C 5.6 10/19/2019   HGBA1C 6.1  (H) 05/18/2019   Lab Results  Component Value Date   INSULIN 13.3 10/19/2019   INSULIN 15.1 05/18/2019   Lab Results  Component Value Date   TSH 2.530 05/18/2019   Lab Results  Component Value Date   CHOL 179 10/19/2019   HDL 37 (L) 10/19/2019   LDLCALC 104 (H) 10/19/2019   TRIG 219 (H) 10/19/2019   Lab Results  Component Value Date   WBC 7.8 05/18/2019   HGB 15.6 05/18/2019   HCT 46.5 05/18/2019   MCV 92 05/18/2019   No results found for: IRON, TIBC, FERRITIN  Attestation Statements:   Reviewed by clinician on day of visit: allergies, medications, problem list, medical history, surgical history, family history, social history, and previous encounter notes.   I, Trixie Dredge, am acting as transcriptionist for Dennard Nip, MD.  I have reviewed the above documentation for accuracy and completeness, and I agree with the above. -  Dennard Nip, MD

## 2020-01-25 MED FILL — VIT D2 1.25 MG (50,000 UNIT: 1.25 MG | 28 days supply | Qty: 4 | Fill #0

## 2020-01-27 ENCOUNTER — Other Ambulatory Visit (INDEPENDENT_AMBULATORY_CARE_PROVIDER_SITE_OTHER): Payer: Self-pay | Admitting: Family Medicine

## 2020-01-27 DIAGNOSIS — E559 Vitamin D deficiency, unspecified: Secondary | ICD-10-CM

## 2020-01-27 MED FILL — UNIFINE PENTIPS 32GX5/32: 32G X 4 MM | 50 days supply | Qty: 100 | Fill #0

## 2020-01-27 MED FILL — SAXENDA 18 MG/3 ML PEN: 18 | 30 days supply | Qty: 15 | Fill #0

## 2020-01-27 MED FILL — ADDERALL XR 15 MG CAP SA: 15 | 30 days supply | Qty: 30 | Fill #0

## 2020-01-28 ENCOUNTER — Ambulatory Visit: Payer: Self-pay | Attending: Internal Medicine

## 2020-01-28 DIAGNOSIS — Z23 Encounter for immunization: Secondary | ICD-10-CM

## 2020-01-28 NOTE — Progress Notes (Signed)
   Covid-19 Vaccination Clinic  Name:  Melissa [name]    MRN: [id number] DOB: May 31, 1971  01/28/2020  [name] was observed post Covid-19 immunization for 15 minutes without incident. She was provided with Vaccine Information Sheet and instruction to access the V-Safe system.   [name] was instructed to call 911 with any severe reactions post vaccine: Marland Kitchen Difficulty breathing  . Swelling of face and throat  . A fast heartbeat  . A bad rash all over body  . Dizziness and weakness   Immunizations Administered    Name Date Dose VIS Date Route   Pfizer COVID-19 Vaccine 01/28/2020  1:55 PM 0.3 mL 10/15/2019 Intramuscular   Manufacturer: [place]   Lot: [id number]   [place]: [id number]

## 2020-02-07 MED FILL — DESVENLAFAXINE SUC ER 50 MG: 50 | 90 days supply | Qty: 90 | Fill #0

## 2020-02-21 ENCOUNTER — Encounter (INDEPENDENT_AMBULATORY_CARE_PROVIDER_SITE_OTHER): Payer: Self-pay | Admitting: Family Medicine

## 2020-02-21 ENCOUNTER — Other Ambulatory Visit: Payer: Self-pay

## 2020-02-21 ENCOUNTER — Ambulatory Visit (INDEPENDENT_AMBULATORY_CARE_PROVIDER_SITE_OTHER): Payer: 59 | Admitting: Family Medicine

## 2020-02-21 ENCOUNTER — Other Ambulatory Visit (INDEPENDENT_AMBULATORY_CARE_PROVIDER_SITE_OTHER): Payer: Self-pay | Admitting: Family Medicine

## 2020-02-21 VITALS — BP 120/76 | HR 76 | Temp 97.6°F | Ht 63.0 in | Wt 197.0 lb

## 2020-02-21 DIAGNOSIS — E559 Vitamin D deficiency, unspecified: Secondary | ICD-10-CM

## 2020-02-21 DIAGNOSIS — R7303 Prediabetes: Secondary | ICD-10-CM | POA: Diagnosis not present

## 2020-02-21 DIAGNOSIS — Z9189 Other specified personal risk factors, not elsewhere classified: Secondary | ICD-10-CM | POA: Diagnosis not present

## 2020-02-21 DIAGNOSIS — Z6835 Body mass index (BMI) 35.0-35.9, adult: Secondary | ICD-10-CM | POA: Diagnosis not present

## 2020-02-21 DIAGNOSIS — E7849 Other hyperlipidemia: Secondary | ICD-10-CM | POA: Diagnosis not present

## 2020-02-21 MED FILL — metFORMIN HCL 500 MG TABS: 500 | 30 days supply | Qty: 60 | Fill #0

## 2020-02-21 MED FILL — TOPIRAMATE 50 MG TABLET: 50 | 30 days supply | Qty: 30 | Fill #0

## 2020-02-22 ENCOUNTER — Ambulatory Visit: Payer: Self-pay | Attending: Internal Medicine

## 2020-02-22 DIAGNOSIS — Z23 Encounter for immunization: Secondary | ICD-10-CM

## 2020-02-22 LAB — COMPREHENSIVE METABOLIC PANEL
ALT: 41 IU/L — ABNORMAL HIGH (ref 0–32)
AST: 28 IU/L (ref 0–40)
Albumin/Globulin Ratio: 1.8 (ref 1.2–2.2)
Albumin: 4.5 g/dL (ref 3.8–4.9)
Alkaline Phosphatase: 79 IU/L (ref 39–117)
BUN/Creatinine Ratio: 27 — ABNORMAL HIGH (ref 9–23)
BUN: 19 mg/dL (ref 6–24)
Bilirubin Total: 0.5 mg/dL (ref 0.0–1.2)
CO2: 21 mmol/L (ref 20–29)
Calcium: 9 mg/dL (ref 8.7–10.2)
Chloride: 105 mmol/L (ref 96–106)
Creatinine, Ser: 0.71 mg/dL (ref 0.57–1.00)
GFR calc Af Amer: 114 mL/min/{1.73_m2} (ref >59)
GFR calc non Af Amer: 99 mL/min/{1.73_m2} (ref >59)
Globulin, Total: 2.5 g/dL (ref 1.5–4.5)
Glucose: 95 mg/dL (ref 65–99)
Potassium: 4.8 mmol/L (ref 3.5–5.2)
Sodium: 139 mmol/L (ref 134–144)
Total Protein: 7 g/dL (ref 6.0–8.5)

## 2020-02-22 LAB — HEMOGLOBIN A1C
Est. average glucose Bld gHb Est-mCnc: 117 mg/dL
Hgb A1c MFr Bld: 5.7 % — ABNORMAL HIGH (ref 4.8–5.6)

## 2020-02-22 LAB — LIPID PANEL WITH LDL/HDL RATIO
Cholesterol, Total: 185 mg/dL (ref 100–199)
HDL: 38 mg/dL — ABNORMAL LOW (ref >39)
LDL Chol Calc (NIH): 105 mg/dL — ABNORMAL HIGH (ref 0–99)
LDL/HDL Ratio: 2.8 ratio (ref 0.0–3.2)
Triglycerides: 245 mg/dL — ABNORMAL HIGH (ref 0–149)
VLDL Cholesterol Cal: 42 mg/dL — ABNORMAL HIGH (ref 5–40)

## 2020-02-22 LAB — VITAMIN D 25 HYDROXY (VIT D DEFICIENCY, FRACTURES): Vit D, 25-Hydroxy: 57.8 ng/mL (ref 30.0–100.0)

## 2020-02-22 LAB — INSULIN, RANDOM: INSULIN: 14.8 u[IU]/mL (ref 2.6–24.9)

## 2020-02-22 NOTE — Progress Notes (Signed)
   Covid-19 Vaccination Clinic  Name:  Melissa [name]    MRN: [id number] DOB: Nov 19, 1970  02/22/2020  [name] was observed post Covid-19 immunization for 15 minutes without incident. She was provided with Vaccine Information Sheet and instruction to access the V-Safe system.   [name] was instructed to call 911 with any severe reactions post vaccine: Marland Kitchen Difficulty breathing  . Swelling of face and throat  . A fast heartbeat  . A bad rash all over body  . Dizziness and weakness   Immunizations Administered    Name Date Dose VIS Date Route   Pfizer COVID-19 Vaccine 02/22/2020 11:53 AM 0.3 mL 12/29/2018 Intramuscular   Manufacturer: [place]   Lot: [id number]   [place]: [id number]

## 2020-02-23 ENCOUNTER — Other Ambulatory Visit (INDEPENDENT_AMBULATORY_CARE_PROVIDER_SITE_OTHER): Payer: Self-pay | Admitting: Family Medicine

## 2020-02-23 ENCOUNTER — Encounter (INDEPENDENT_AMBULATORY_CARE_PROVIDER_SITE_OTHER): Payer: Self-pay

## 2020-02-23 DIAGNOSIS — E559 Vitamin D deficiency, unspecified: Secondary | ICD-10-CM

## 2020-02-23 NOTE — Progress Notes (Signed)
Chief Complaint:   OBESITY Melissa [name] is here to discuss her progress with her obesity treatment plan along with follow-up of her obesity related diagnoses. Melissa [name] is on the Category 2 Plan and states she is following her eating plan approximately 50% of the time. Melissa [name] states she is doing [employer] for 30 minutes 3 times per week.  Today's visit was #: 13 Starting weight: 219 lbs Starting date: 05/18/2019 Today's weight: 197 lbs Today's date: 02/21/2020 Total lbs lost to date: 22 Total lbs lost since last in-office visit: 0  Interim History: Melissa [name] started going to [employer] and has found herself to be hungrier with starting exercising. She hasn't weighed her meat at dinner. She has a camping trip in May, but no one else is going on it. She has been doing [employer] a few nights for dinner per week.  Subjective:   1. Pre-diabetes Melissa [name]'s Last A1c was 5.6 and insulin 13.3. She is metformin BID.  2. Vitamin D deficiency Melissa [name] denies nausea, vomiting, or muscle weakness, but she notes fatigue. She is on prescription Vit D.  3. Other hyperlipidemia Melissa [name]'s last LDL was 104 and HDL 37. She is not on statin.  4. At risk for heart disease Melissa [name] is at a higher than average risk for cardiovascular disease due to obesity.   Assessment/Plan:   1. Pre-diabetes Melissa [name] will continue to work on weight loss, exercise, and decreasing simple carbohydrates to help decrease the risk of diabetes. We will check labs today.  - Comprehensive metabolic panel - Hemoglobin A1c - Insulin, random  2. Vitamin D deficiency Low Vitamin D level contributes to fatigue and are associated with obesity, breast, and colon cancer. Melissa [name] agreed to continue taking prescription Vitamin D 50,000 IU every week and will follow-up for routine testing of Vitamin D, at least 2-3 times per year to avoid over-replacement. We will check labs today.  - VITAMIN D 25 Hydroxy (Vit-D Deficiency, Fractures)  3. Other  hyperlipidemia Cardiovascular risk and specific lipid/LDL goals reviewed. We discussed several lifestyle modifications today and Melissa [name] will continue to work on diet, exercise and weight loss efforts. We will check labs today. Orders and follow up as documented in patient record.   Counseling Intensive lifestyle modifications are the first line treatment for this issue. . Dietary changes: Increase soluble fiber. Decrease simple carbohydrates. . Exercise changes: Moderate to vigorous-intensity aerobic activity 150 minutes per week if tolerated. . Lipid-lowering medications: see documented in medical record.  - Lipid Panel With LDL/HDL Ratio  4. At risk for heart disease Melissa [name] was given approximately 15 minutes of coronary artery disease prevention counseling today. She is 49 y.o. female and has risk factors for heart disease including obesity. We discussed intensive lifestyle modifications today with an emphasis on specific weight loss instructions and strategies.   Repetitive spaced learning was employed today to elicit superior memory formation and behavioral change.  5. Class 2 severe obesity with serious comorbidity and body mass index (BMI) of 35.0 to 35.9 in adult, unspecified obesity type (HCC) Melissa [name] is currently in the action stage of change. As such, her goal is to continue with weight loss efforts. She has agreed to the Category 2 Plan with 8 oz of protein at dinner.   Exercise goals: As is.  Behavioral modification strategies: increasing lean protein intake, increasing vegetables, meal planning and cooking strategies and keeping healthy foods in the home.  Melissa [name] has agreed to follow-up with our clinic in 2 weeks. She was informed of  the importance of frequent follow-up visits to maximize her success with intensive lifestyle modifications for her multiple health conditions.   Melissa [name] was informed we would discuss her lab results at her next visit unless there is a critical issue that  needs to be addressed sooner. Melissa [name] agreed to keep her next visit at the agreed upon time to discuss these results.  Objective:   Blood pressure 120/76, pulse 76, temperature 97.6 F (36.4 C), temperature source Oral, height 5\' 3"  (1.6 m), weight 197 lb (89.4 kg), SpO2 97 %. Body mass index is 34.9 kg/m.  General: Cooperative, alert, well developed, in no acute distress. HEENT: Conjunctivae and lids unremarkable. Cardiovascular: Regular rhythm.  Lungs: Normal work of breathing. Neurologic: No focal deficits.   Lab Results  Component Value Date   CREATININE 0.71 02/21/2020   BUN 19 02/21/2020   NA 139 02/21/2020   K 4.8 02/21/2020   CL 105 02/21/2020   CO2 21 02/21/2020   Lab Results  Component Value Date   ALT 41 (H) 02/21/2020   AST 28 02/21/2020   ALKPHOS 79 02/21/2020   BILITOT 0.5 02/21/2020   Lab Results  Component Value Date   HGBA1C 5.7 (H) 02/21/2020   HGBA1C 5.6 10/19/2019   HGBA1C 6.1 (H) 05/18/2019   Lab Results  Component Value Date   INSULIN 14.8 02/21/2020   INSULIN 13.3 10/19/2019   INSULIN 15.1 05/18/2019   Lab Results  Component Value Date   TSH 2.530 05/18/2019   Lab Results  Component Value Date   CHOL 185 02/21/2020   HDL 38 (L) 02/21/2020   LDLCALC 105 (H) 02/21/2020   TRIG 245 (H) 02/21/2020   Lab Results  Component Value Date   WBC 7.8 05/18/2019   HGB 15.6 05/18/2019   HCT 46.5 05/18/2019   MCV 92 05/18/2019   No results found for: IRON, TIBC, FERRITIN  Attestation Statements:   This is the patient's first visit at [facility]. The patient's NEW PATIENT PACKET was reviewed at length. Included in the packet: current and past health history, medications, allergies, ROS, gynecologic history (women only), surgical history, family history, social history, weight history, weight loss surgery history (for those that have had weight loss surgery), nutritional evaluation, mood and food questionnaire, PHQ9, Epworth  questionnaire, sleep habits questionnaire, patient life and health improvement goals questionnaire. These will all be scanned into the patient's chart under media.   During the visit, I independently reviewed the patient's EKG, bioimpedance scale results, and indirect calorimeter results. I used this information to tailor a meal plan for the patient that will help her to lose weight and will improve her obesity-related conditions going forward. I performed a medically necessary appropriate examination and/or evaluation. I discussed the assessment and treatment plan with the patient. The patient was provided an opportunity to ask questions and all were answered. The patient agreed with the plan and demonstrated an understanding of the instructions. Labs were ordered at this visit and will be reviewed at the next visit unless more critical results need to be addressed immediately. Clinical information was updated and documented in the EMR.   Time spent on visit including pre-visit chart review and post-visit care was 45 minutes.   A separate 15 minutes was spent on risk counseling (see above).    I, Trixie Dredge, am acting as transcriptionist for April Manson, MD.  I have reviewed the above documentation for accuracy and completeness, and I agree with the above. - Ilene Qua,  MD   

## 2020-03-02 MED FILL — ADDERALL XR 15 MG CAP SA: 15 | 30 days supply | Qty: 30 | Fill #0

## 2020-03-13 ENCOUNTER — Other Ambulatory Visit (INDEPENDENT_AMBULATORY_CARE_PROVIDER_SITE_OTHER): Payer: Self-pay | Admitting: Family Medicine

## 2020-03-13 ENCOUNTER — Encounter (INDEPENDENT_AMBULATORY_CARE_PROVIDER_SITE_OTHER): Payer: Self-pay | Admitting: Family Medicine

## 2020-03-13 ENCOUNTER — Other Ambulatory Visit: Payer: Self-pay

## 2020-03-13 ENCOUNTER — Ambulatory Visit (INDEPENDENT_AMBULATORY_CARE_PROVIDER_SITE_OTHER): Payer: 59 | Admitting: Family Medicine

## 2020-03-13 VITALS — BP 109/76 | HR 82 | Temp 97.8°F | Ht 63.0 in | Wt 196.0 lb

## 2020-03-13 DIAGNOSIS — Z9189 Other specified personal risk factors, not elsewhere classified: Secondary | ICD-10-CM | POA: Diagnosis not present

## 2020-03-13 DIAGNOSIS — Z6834 Body mass index (BMI) 34.0-34.9, adult: Secondary | ICD-10-CM | POA: Diagnosis not present

## 2020-03-13 DIAGNOSIS — F3289 Other specified depressive episodes: Secondary | ICD-10-CM

## 2020-03-13 DIAGNOSIS — E669 Obesity, unspecified: Secondary | ICD-10-CM

## 2020-03-13 DIAGNOSIS — E559 Vitamin D deficiency, unspecified: Secondary | ICD-10-CM

## 2020-03-13 DIAGNOSIS — R7303 Prediabetes: Secondary | ICD-10-CM

## 2020-03-13 MED ORDER — VITAMIN D (ERGOCALCIFEROL) 1.25 MG (50000 UNIT) PO CAPS: ORAL_CAPSULE | ORAL | 0 refills | Status: DC

## 2020-03-13 MED ORDER — METFORMIN HCL 500 MG PO TABS: 500.0000 mg | ORAL_TABLET | Freq: Two times a day (BID) | ORAL | 0 refills | Status: DC

## 2020-03-13 MED ORDER — SAXENDA 18 MG/3ML ~~LOC~~ SOPN: 3.0000 mg | PEN_INJECTOR | Freq: Every day | SUBCUTANEOUS | 0 refills | Status: DC

## 2020-03-13 MED ORDER — TOPIRAMATE 50 MG PO TABS: 50.0000 mg | ORAL_TABLET | Freq: Every day | ORAL | 0 refills | Status: DC

## 2020-03-14 NOTE — Progress Notes (Signed)
Chief Complaint:   OBESITY Melissa [name] is here to discuss her progress with her obesity treatment plan along with follow-up of her obesity related diagnoses. [name] is on the Category 2 Plan with 8 oz of protein at dinner and states she is following her eating plan approximately 50% of the time. [name] states she is at the gym for 30 minutes 1 time per week.  Today's visit was #: 14 Starting weight: 219 lbs Starting date: 05/18/2019 Today's weight: 196 lbs Today's date: 03/13/2020 Total lbs lost to date: 23 Total lbs lost since last in-office visit: 1  Interim History: [name] continues to do well with weight loss. Her hunger is controlled on Saxenda, and she hasn't been following her plan as closely but she has been mindful.  Subjective:   1. Vitamin D deficiency [name]'s Vit D level is now at goal. She denies nausea, vomiting, or muscle weakness. I discussed labs with the patient today.  2. Pre-diabetes [name]'s A1c has improved since her first visit. She is stable on metformin and liraglutide.   3. Other depression, emotional eating [name] is stable on Topamax, and she feels she has done well overall with decreasing emotional eating.  4. At risk for impaired function of liver [name] is at risk for impaired function of liver due to mildly elevated LFT.   Assessment/Plan:   1. Vitamin D deficiency Low Vitamin D level contributes to fatigue and are associated with obesity, breast, and colon cancer. We will refill prescription Vitamin D for 1 month. [name] will follow-up for routine testing of Vitamin D, at least 2-3 times per year to avoid over-replacement.  - Vitamin D, Ergocalciferol, (DRISDOL) 1.25 MG (50000 UNIT) CAPS capsule; 1 capsule oral per week  Dispense: 4 capsule; Refill: 0  2. Pre-diabetes [name] will continue to work on weight loss, exercise, and decreasing simple carbohydrates to help decrease the risk of diabetes. We will refill metformin for 1 month.  - metFORMIN  (GLUCOPHAGE) 500 MG tablet; Take 1 tablet (500 mg total) by mouth 2 (two) times daily with a meal.  Dispense: 60 tablet; Refill: 0  3. Other depression, emotional eating Behavior modification techniques were discussed today to help [name] deal with her emotional/non-hunger eating behaviors. We will refill Topamax for 1 month. Orders and follow up as documented in patient record.   - topiramate (TOPAMAX) 50 MG tablet; Take 1 tablet (50 mg total) by mouth daily.  Dispense: 30 tablet; Refill: 0  4. At risk for impaired function of liver [name] was given approximately 15 minutes of counseling today regarding prevention of impaired liver function. [name] was educated about her risk of developing NASH or even liver failure and advised that the only proven treatment for NAFLD was weight loss of at least 5-10% of body weight. [name] will continue to work on weight loss, and we will recheck lab in 3 months.  5. Class 1 obesity with serious comorbidity and body mass index (BMI) of 34.0 to 34.9 in adult, unspecified obesity type [name] is currently in the action stage of change. As such, her goal is to continue with weight loss efforts. She has agreed to the Category 2 Plan.   We discussed various medication options to help [name] with her weight loss efforts and we both agreed to continue Saxenda at 1.8 mg q daily, and we will refill for 1 month.  - Liraglutide -Weight Management (SAXENDA) 18 MG/3ML SOPN; Inject 0.5 mLs (3 mg total) into the skin daily.  Dispense:  15 mL; Refill: 0  Exercise goals: As is.  Behavioral modification strategies: increasing lean protein intake and meal planning and cooking strategies.  [name] has agreed to follow-up with our clinic in 3 weeks. She was informed of the importance of frequent follow-up visits to maximize her success with intensive lifestyle modifications for her multiple health conditions.   Objective:   Blood pressure 109/76, pulse 82, temperature 97.8 F (36.6 C),  temperature source Oral, height 5\' 3"  (1.6 m), weight 196 lb (88.9 kg), SpO2 97 %. Body mass index is 34.72 kg/m.  General: Cooperative, alert, well developed, in no acute distress. HEENT: Conjunctivae and lids unremarkable. Cardiovascular: Regular rhythm.  Lungs: Normal work of breathing. Neurologic: No focal deficits.   Lab Results  Component Value Date   CREATININE 0.71 02/21/2020   BUN 19 02/21/2020   NA 139 02/21/2020   K 4.8 02/21/2020   CL 105 02/21/2020   CO2 21 02/21/2020   Lab Results  Component Value Date   ALT 41 (H) 02/21/2020   AST 28 02/21/2020   ALKPHOS 79 02/21/2020   BILITOT 0.5 02/21/2020   Lab Results  Component Value Date   HGBA1C 5.7 (H) 02/21/2020   HGBA1C 5.6 10/19/2019   HGBA1C 6.1 (H) 05/18/2019   Lab Results  Component Value Date   INSULIN 14.8 02/21/2020   INSULIN 13.3 10/19/2019   INSULIN 15.1 05/18/2019   Lab Results  Component Value Date   TSH 2.530 05/18/2019   Lab Results  Component Value Date   CHOL 185 02/21/2020   HDL 38 (L) 02/21/2020   LDLCALC 105 (H) 02/21/2020   TRIG 245 (H) 02/21/2020   Lab Results  Component Value Date   WBC 7.8 05/18/2019   HGB 15.6 05/18/2019   HCT 46.5 05/18/2019   MCV 92 05/18/2019   No results found for: IRON, TIBC, FERRITIN  Attestation Statements:   Reviewed by clinician on day of visit: allergies, medications, problem list, medical history, surgical history, family history, social history, and previous encounter notes.   I, Trixie Dredge, am acting as transcriptionist for Dennard Nip, MD.  I have reviewed the above documentation for accuracy and completeness, and I agree with the above. -  Dennard Nip, MD

## 2020-03-22 MED FILL — SAXENDA 18 MG/3 ML PEN: 18 | 30 days supply | Qty: 15 | Fill #0

## 2020-03-28 MED FILL — metFORMIN HCL 500 MG TABS: 500 | 30 days supply | Qty: 60 | Fill #0

## 2020-03-28 MED FILL — TOPIRAMATE 50 MG TABLET: 50 | 30 days supply | Qty: 30 | Fill #0

## 2020-03-29 DIAGNOSIS — F900 Attention-deficit hyperactivity disorder, predominantly inattentive type: Secondary | ICD-10-CM | POA: Diagnosis not present

## 2020-03-29 DIAGNOSIS — F3341 Major depressive disorder, recurrent, in partial remission: Secondary | ICD-10-CM | POA: Diagnosis not present

## 2020-04-05 MED FILL — ADDERALL XR 15 MG CAP SA: 15 | 30 days supply | Qty: 30 | Fill #0

## 2020-04-06 ENCOUNTER — Ambulatory Visit (INDEPENDENT_AMBULATORY_CARE_PROVIDER_SITE_OTHER): Payer: 59 | Admitting: Family Medicine

## 2020-04-24 ENCOUNTER — Other Ambulatory Visit: Payer: Self-pay

## 2020-04-24 ENCOUNTER — Ambulatory Visit (INDEPENDENT_AMBULATORY_CARE_PROVIDER_SITE_OTHER): Payer: 59 | Admitting: Family Medicine

## 2020-04-24 ENCOUNTER — Encounter (INDEPENDENT_AMBULATORY_CARE_PROVIDER_SITE_OTHER): Payer: Self-pay | Admitting: Family Medicine

## 2020-04-24 VITALS — BP 121/81 | HR 78 | Temp 97.9°F | Ht 63.0 in | Wt 194.0 lb

## 2020-04-24 DIAGNOSIS — E559 Vitamin D deficiency, unspecified: Secondary | ICD-10-CM

## 2020-04-24 DIAGNOSIS — Z9189 Other specified personal risk factors, not elsewhere classified: Secondary | ICD-10-CM | POA: Diagnosis not present

## 2020-04-24 DIAGNOSIS — E669 Obesity, unspecified: Secondary | ICD-10-CM

## 2020-04-24 DIAGNOSIS — Z6834 Body mass index (BMI) 34.0-34.9, adult: Secondary | ICD-10-CM

## 2020-04-24 DIAGNOSIS — F3289 Other specified depressive episodes: Secondary | ICD-10-CM | POA: Diagnosis not present

## 2020-04-24 MED ORDER — DESVENLAFAXINE SUCCINATE ER 50 MG PO TB24: 50.0000 mg | ORAL_TABLET | Freq: Every day | ORAL | 0 refills | Status: DC

## 2020-04-24 MED ORDER — INSULIN PEN NEEDLE 32G X 4 MM MISC: 0 refills | Status: DC

## 2020-04-24 MED ORDER — SAXENDA 18 MG/3ML ~~LOC~~ SOPN: 3.0000 mg | PEN_INJECTOR | Freq: Every day | SUBCUTANEOUS | 0 refills | Status: DC

## 2020-04-24 MED ORDER — VITAMIN D (ERGOCALCIFEROL) 1.25 MG (50000 UNIT) PO CAPS: ORAL_CAPSULE | ORAL | 0 refills | Status: DC

## 2020-04-24 MED ORDER — TOPIRAMATE 50 MG PO TABS: 50.0000 mg | ORAL_TABLET | Freq: Every day | ORAL | 0 refills | Status: DC

## 2020-04-24 MED FILL — SAXENDA 18 MG/3 ML PEN: 18 | 30 days supply | Qty: 15 | Fill #0

## 2020-04-24 MED FILL — UNIFINE PENTIPS 32GX5/32: 32G X 4 MM | 90 days supply | Qty: 100 | Fill #0

## 2020-04-24 MED FILL — TOPIRAMATE 50 MG TABLET: 50 | 30 days supply | Qty: 30 | Fill #0

## 2020-04-24 MED FILL — VIT D2 1.25 MG (50,000 UNIT: 1.25 MG | 28 days supply | Qty: 4 | Fill #0

## 2020-04-26 NOTE — Progress Notes (Signed)
Chief Complaint:   OBESITY Melissa [name] is here to discuss her progress with her obesity treatment plan along with follow-up of her obesity related diagnoses. Melissa [name] is on the Category 2 Plan and states she is following her eating plan approximately 50% of the time. Melissa [name] states she is walking at work.  Today's visit was #: 15 Starting weight: 219 lbs Starting date: 05/18/2019 Today's weight: 194 lbs Today's date: 04/24/2020 Total lbs lost to date: 25 Total lbs lost since last in-office visit: 2  Interim History: Melissa [name] continues to do well with weight loss over the last month. She recently started working at [employer] part time and she thinks this will help keep her too busy to do boredom snacking. She is tolerating Saxenda well.  Subjective:   1. Vitamin D deficiency Melissa [name] is stable on Vit D, and she requests a refill today. She denies nausea, vomiting, or muscle weakness.  2. Other depression, emotional eating Melissa [name] is doing well on her medications and she feels she is doing better decreasing emotional eating and boredom eating.  3. At risk for complication associated with hypotension The patient is at a higher than average risk of hypotension due to weight loss and low blood pressure.  Assessment/Plan:   1. Vitamin D deficiency Low Vitamin D level contributes to fatigue and are associated with obesity, breast, and colon cancer. We will refill prescription Vitamin D for 1 month. Melissa [name] will follow-up for routine testing of Vitamin D, at least 2-3 times per year to avoid over-replacement.  - Vitamin D, Ergocalciferol, (DRISDOL) 1.25 MG (50000 UNIT) CAPS capsule; 1 capsule oral per week  Dispense: 4 capsule; Refill: 0  2. Other depression, emotional eating Behavior modification techniques were discussed today to help Melissa [name] deal with her emotional/non-hunger eating behaviors. We will refill Pristiq and Topamax for 1 month. Orders and follow up as documented in patient record.   -  topiramate (TOPAMAX) 50 MG tablet; Take 1 tablet (50 mg total) by mouth daily.  Dispense: 30 tablet; Refill: 0 - desvenlafaxine (PRISTIQ) 50 MG 24 hr tablet; Take 1 tablet (50 mg total) by mouth daily.  Dispense: 30 tablet; Refill: 0  3. At risk for complication associated with hypotension Melissa [name] was given approximately 15 minutes of education and counseling today to help avoid hypotension. We discussed risks of hypotension with weight loss and signs of hypotension such as feeling lightheaded or unsteady.  Repetitive spaced learning was employed today to elicit superior memory formation and behavioral change.  4. Class 1 obesity with serious comorbidity and body mass index (BMI) of 34.0 to 34.9 in adult, unspecified obesity type Melissa [name] is currently in the action stage of change. As such, her goal is to continue with weight loss efforts. She has agreed to the Category 2 Plan.   We discussed various medication options to help Melissa [name] with her weight loss efforts and we both agreed to continue Saxenda and we will refill for 1 month, and will refill pen needles #100 with no refills.  - Liraglutide -Weight Management (SAXENDA) 18 MG/3ML SOPN; Inject 0.5 mLs (3 mg total) into the skin daily.  Dispense: 15 mL; Refill: 0 - Insulin Pen Needle 32G X 4 MM MISC; Give injection once daily  Dispense: 100 each; Refill: 0  Exercise goals: As is.  Behavioral modification strategies: meal planning and cooking strategies.  Melissa [name] has agreed to follow-up with our clinic in 4 weeks. She was informed of the importance of frequent follow-up visits to maximize her  success with intensive lifestyle modifications for her multiple health conditions.   Objective:   Blood pressure 121/81, pulse 78, temperature 97.9 F (36.6 C), temperature source Oral, height 5\' 3"  (1.6 m), weight 194 lb (88 kg), SpO2 97 %. Body mass index is 34.37 kg/m.  General: Cooperative, alert, well developed, in no acute distress. HEENT:  Conjunctivae and lids unremarkable. Cardiovascular: Regular rhythm.  Lungs: Normal work of breathing. Neurologic: No focal deficits.   Lab Results  Component Value Date   CREATININE 0.71 02/21/2020   BUN 19 02/21/2020   NA 139 02/21/2020   K 4.8 02/21/2020   CL 105 02/21/2020   CO2 21 02/21/2020   Lab Results  Component Value Date   ALT 41 (H) 02/21/2020   AST 28 02/21/2020   ALKPHOS 79 02/21/2020   BILITOT 0.5 02/21/2020   Lab Results  Component Value Date   HGBA1C 5.7 (H) 02/21/2020   HGBA1C 5.6 10/19/2019   HGBA1C 6.1 (H) 05/18/2019   Lab Results  Component Value Date   INSULIN 14.8 02/21/2020   INSULIN 13.3 10/19/2019   INSULIN 15.1 05/18/2019   Lab Results  Component Value Date   TSH 2.530 05/18/2019   Lab Results  Component Value Date   CHOL 185 02/21/2020   HDL 38 (L) 02/21/2020   LDLCALC 105 (H) 02/21/2020   TRIG 245 (H) 02/21/2020   Lab Results  Component Value Date   WBC 7.8 05/18/2019   HGB 15.6 05/18/2019   HCT 46.5 05/18/2019   MCV 92 05/18/2019   No results found for: IRON, TIBC, FERRITIN  Attestation Statements:   Reviewed by clinician on day of visit: allergies, medications, problem list, medical history, surgical history, family history, social history, and previous encounter notes.   I, Trixie Dredge, am acting as transcriptionist for Dennard Nip, MD.  I have reviewed the above documentation for accuracy and completeness, and I agree with the above. -  Dennard Nip, MD

## 2020-04-28 MED FILL — DESVENLAFAXINE SUC ER 50 MG: 50 | 30 days supply | Qty: 30 | Fill #0

## 2020-05-10 MED FILL — ADDERALL XR 15 MG CAP SA: 15 | 30 days supply | Qty: 30 | Fill #0

## 2020-05-20 ENCOUNTER — Other Ambulatory Visit (INDEPENDENT_AMBULATORY_CARE_PROVIDER_SITE_OTHER): Payer: Self-pay | Admitting: Family Medicine

## 2020-05-20 DIAGNOSIS — R7303 Prediabetes: Secondary | ICD-10-CM

## 2020-05-20 DIAGNOSIS — F3289 Other specified depressive episodes: Secondary | ICD-10-CM

## 2020-05-20 DIAGNOSIS — E559 Vitamin D deficiency, unspecified: Secondary | ICD-10-CM

## 2020-05-22 ENCOUNTER — Ambulatory Visit (INDEPENDENT_AMBULATORY_CARE_PROVIDER_SITE_OTHER): Payer: 59 | Admitting: Family Medicine

## 2020-05-22 ENCOUNTER — Encounter (INDEPENDENT_AMBULATORY_CARE_PROVIDER_SITE_OTHER): Payer: Self-pay | Admitting: Family Medicine

## 2020-05-22 ENCOUNTER — Other Ambulatory Visit: Payer: Self-pay

## 2020-05-22 VITALS — BP 126/84 | HR 87 | Temp 97.9°F | Ht 63.0 in | Wt 190.0 lb

## 2020-05-22 DIAGNOSIS — Z9189 Other specified personal risk factors, not elsewhere classified: Secondary | ICD-10-CM

## 2020-05-22 DIAGNOSIS — F3289 Other specified depressive episodes: Secondary | ICD-10-CM | POA: Diagnosis not present

## 2020-05-22 DIAGNOSIS — R7303 Prediabetes: Secondary | ICD-10-CM

## 2020-05-22 DIAGNOSIS — E559 Vitamin D deficiency, unspecified: Secondary | ICD-10-CM

## 2020-05-22 DIAGNOSIS — E669 Obesity, unspecified: Secondary | ICD-10-CM

## 2020-05-22 DIAGNOSIS — Z6833 Body mass index (BMI) 33.0-33.9, adult: Secondary | ICD-10-CM

## 2020-05-22 MED ORDER — DESVENLAFAXINE SUCCINATE ER 50 MG PO TB24: 50.0000 mg | ORAL_TABLET | Freq: Every day | ORAL | 0 refills | Status: DC

## 2020-05-22 MED ORDER — SAXENDA 18 MG/3ML ~~LOC~~ SOPN: 3.0000 mg | PEN_INJECTOR | Freq: Every day | SUBCUTANEOUS | 0 refills | Status: DC

## 2020-05-22 MED ORDER — METFORMIN HCL 500 MG PO TABS: 500.0000 mg | ORAL_TABLET | Freq: Two times a day (BID) | ORAL | 0 refills | Status: DC

## 2020-05-22 MED ORDER — VITAMIN D (ERGOCALCIFEROL) 1.25 MG (50000 UNIT) PO CAPS: ORAL_CAPSULE | ORAL | 0 refills | Status: DC

## 2020-05-22 MED ORDER — TOPIRAMATE 50 MG PO TABS: 50.0000 mg | ORAL_TABLET | Freq: Every day | ORAL | 0 refills | Status: DC

## 2020-05-22 MED FILL — VIT D2 1.25 MG (50,000 UNIT: 1.25 MG | 28 days supply | Qty: 4 | Fill #0

## 2020-05-22 MED FILL — TOPIRAMATE 50 MG TABLET: 50 | 30 days supply | Qty: 30 | Fill #0

## 2020-05-22 MED FILL — SAXENDA 18 MG/3 ML PEN: 18 | 30 days supply | Qty: 15 | Fill #0

## 2020-05-22 MED FILL — METFORMIN HCL 500 MG TABS: 500 | 30 days supply | Qty: 60 | Fill #0

## 2020-05-23 LAB — LIPID PANEL WITH LDL/HDL RATIO
Cholesterol, Total: 182 mg/dL (ref 100–199)
HDL: 40 mg/dL (ref >39)
LDL Chol Calc (NIH): 116 mg/dL — ABNORMAL HIGH (ref 0–99)
LDL/HDL Ratio: 2.9 ratio (ref 0.0–3.2)
Triglycerides: 147 mg/dL (ref 0–149)
VLDL Cholesterol Cal: 26 mg/dL (ref 5–40)

## 2020-05-23 LAB — COMPREHENSIVE METABOLIC PANEL
ALT: 45 IU/L — ABNORMAL HIGH (ref 0–32)
AST: 26 IU/L (ref 0–40)
Albumin/Globulin Ratio: 1.8 (ref 1.2–2.2)
Albumin: 4.4 g/dL (ref 3.8–4.9)
Alkaline Phosphatase: 81 IU/L (ref 48–121)
BUN/Creatinine Ratio: 20 (ref 9–23)
BUN: 16 mg/dL (ref 6–24)
Bilirubin Total: 0.5 mg/dL (ref 0.0–1.2)
CO2: 21 mmol/L (ref 20–29)
Calcium: 9.5 mg/dL (ref 8.7–10.2)
Chloride: 105 mmol/L (ref 96–106)
Creatinine, Ser: 0.79 mg/dL (ref 0.57–1.00)
GFR calc Af Amer: 100 mL/min/{1.73_m2} (ref >59)
GFR calc non Af Amer: 87 mL/min/{1.73_m2} (ref >59)
Globulin, Total: 2.5 g/dL (ref 1.5–4.5)
Glucose: 101 mg/dL — ABNORMAL HIGH (ref 65–99)
Potassium: 4.5 mmol/L (ref 3.5–5.2)
Sodium: 139 mmol/L (ref 134–144)
Total Protein: 6.9 g/dL (ref 6.0–8.5)

## 2020-05-23 LAB — VITAMIN D 25 HYDROXY (VIT D DEFICIENCY, FRACTURES): Vit D, 25-Hydroxy: 56.3 ng/mL (ref 30.0–100.0)

## 2020-05-23 LAB — HEMOGLOBIN A1C
Est. average glucose Bld gHb Est-mCnc: 117 mg/dL
Hgb A1c MFr Bld: 5.7 % — ABNORMAL HIGH (ref 4.8–5.6)

## 2020-05-23 LAB — INSULIN, RANDOM: INSULIN: 15.5 u[IU]/mL (ref 2.6–24.9)

## 2020-05-24 NOTE — Progress Notes (Signed)
Chief Complaint:   OBESITY Melissa [name] is here to discuss her progress with her obesity treatment plan along with follow-up of her obesity related diagnoses. Melissa [name] is on the Category 2 Plan and states she is following her eating plan approximately 50% of the time. [name] states she is doing 0 minutes 0 times per week.  Today's visit was #: 55 Starting weight: 219 lbs Starting date: 05/18/2019 Today's weight: 190 lbs Today's date: 05/22/2020 Total lbs lost to date: 29 Total lbs lost since last in-office visit: 4  Interim History: Blimie continues to do well with weight loss on her Category 2 plan. Her hunger is controlled and she is doing well with lean protein. Her activity has decreased due to heat.  Subjective:   1. Pre-diabetes Melissa [name] is stable on metformin, and she denies nausea or vomiting. She is doing well with diet.  2. Vitamin D deficiency Melissa [name] is stable on Vit D, and she denies nausea, vomiting, or muscle weakness. She is due for labs.  3. Other depression with emotional eating Melissa [name]'s mood is stable on her medications. She is decreasing emotional eating, and her blood pressure is stable.  4. At risk for dehydration Melissa [name] is at risk for dehydration due to inadequate water.  Assessment/Plan:   1. Pre-diabetes Jim will continue to work on weight loss, exercise, and decreasing simple carbohydrates to help decrease the risk of diabetes. We will check labs today, and we will refill metformin for 1 month.  - Comprehensive metabolic panel - Hemoglobin A1c - Insulin, random - Lipid Panel With LDL/HDL Ratio - metFORMIN (GLUCOPHAGE) 500 MG tablet; Take 1 tablet (500 mg total) by mouth 2 (two) times daily with a meal.  Dispense: 60 tablet; Refill: 0  2. Vitamin D deficiency Low Vitamin D level contributes to fatigue and are associated with obesity, breast, and colon cancer. We will check labs today, and we will refill prescription Vitamin D for 1 month. [name] will follow-up  for routine testing of Vitamin D, at least 2-3 times per year to avoid over-replacement.  - VITAMIN D 25 Hydroxy (Vit-D Deficiency, Fractures) - Vitamin D, Ergocalciferol, (DRISDOL) 1.25 MG (50000 UNIT) CAPS capsule; 1 capsule oral per week  Dispense: 4 capsule; Refill: 0  3. Other depression with emotional eating Behavior modification techniques were discussed today to help Aveen deal with her emotional/non-hunger eating behaviors. We will refill Pristiq and Topamax for 1 month. Orders and follow up as documented in patient record.   - desvenlafaxine (PRISTIQ) 50 MG 24 hr tablet; Take 1 tablet (50 mg total) by mouth daily.  Dispense: 30 tablet; Refill: 0 - topiramate (TOPAMAX) 50 MG tablet; Take 1 tablet (50 mg total) by mouth daily.  Dispense: 30 tablet; Refill: 0  4. At risk for dehydration Gerilynn was given approximately 15 minutes dehydration prevention counseling today. Melissa [name] is at risk for dehydration due to weight loss and current medication(s). She was encouraged to hydrate and monitor fluid status to avoid dehydration as well as weight loss plateaus.   5. Class 1 obesity with serious comorbidity and body mass index (BMI) of 33.0 to 33.9 in adult, unspecified obesity type Melissa [name] is currently in the action stage of change. As such, her goal is to continue with weight loss efforts. She has agreed to the Category 2 Plan.   We discussed various medication options to help Melissa [name] with her weight loss efforts and we both agreed to continue Saxenda, and we will refill for 1 month.  -  Liraglutide -Weight Management (SAXENDA) 18 MG/3ML SOPN; Inject 0.5 mLs (3 mg total) into the skin daily.  Dispense: 15 mL; Refill: 0  Exercise goals: All adults should avoid inactivity. Some physical activity is better than none, and adults who participate in any amount of physical activity gain some health benefits.  Behavioral modification strategies: increasing water intake.  Melissa [name] has agreed to follow-up with  our clinic in 3 weeks. She was informed of the importance of frequent follow-up visits to maximize her success with intensive lifestyle modifications for her multiple health conditions.   Melissa [name] was informed we would discuss her lab results at her next visit unless there is a critical issue that needs to be addressed sooner. Melissa [name] agreed to keep her next visit at the agreed upon time to discuss these results.  Objective:   Blood pressure 126/84, pulse 87, temperature 97.9 F (36.6 C), temperature source Oral, height 5\' 3"  (1.6 m), weight 190 lb (86.2 kg), SpO2 98 %. Body mass index is 33.66 kg/m.  General: Cooperative, alert, well developed, in no acute distress. HEENT: Conjunctivae and lids unremarkable. Cardiovascular: Regular rhythm.  Lungs: Normal work of breathing. Neurologic: No focal deficits.   Lab Results  Component Value Date   CREATININE 0.79 05/22/2020   BUN 16 05/22/2020   NA 139 05/22/2020   K 4.5 05/22/2020   CL 105 05/22/2020   CO2 21 05/22/2020   Lab Results  Component Value Date   ALT 45 (H) 05/22/2020   AST 26 05/22/2020   ALKPHOS 81 05/22/2020   BILITOT 0.5 05/22/2020   Lab Results  Component Value Date   HGBA1C 5.7 (H) 05/22/2020   HGBA1C 5.7 (H) 02/21/2020   HGBA1C 5.6 10/19/2019   HGBA1C 6.1 (H) 05/18/2019   Lab Results  Component Value Date   INSULIN 15.5 05/22/2020   INSULIN 14.8 02/21/2020   INSULIN 13.3 10/19/2019   INSULIN 15.1 05/18/2019   Lab Results  Component Value Date   TSH 2.530 05/18/2019   Lab Results  Component Value Date   CHOL 182 05/22/2020   HDL 40 05/22/2020   LDLCALC 116 (H) 05/22/2020   TRIG 147 05/22/2020   Lab Results  Component Value Date   WBC 7.8 05/18/2019   HGB 15.6 05/18/2019   HCT 46.5 05/18/2019   MCV 92 05/18/2019   No results found for: IRON, TIBC, FERRITIN  Attestation Statements:   Reviewed by clinician on day of visit: allergies, medications, problem list, medical history, surgical history,  family history, social history, and previous encounter notes.   I, Trixie Dredge, am acting as transcriptionist for Dennard Nip, MD.  I have reviewed the above documentation for accuracy and completeness, and I agree with the above. -  Dennard Nip, MD

## 2020-05-26 MED FILL — DESVENLAFAXINE SUC ER 50 MG: 50 | 30 days supply | Qty: 30 | Fill #0

## 2020-05-29 ENCOUNTER — Other Ambulatory Visit (INDEPENDENT_AMBULATORY_CARE_PROVIDER_SITE_OTHER): Payer: Self-pay | Admitting: Family Medicine

## 2020-05-29 DIAGNOSIS — F3289 Other specified depressive episodes: Secondary | ICD-10-CM

## 2020-06-05 ENCOUNTER — Other Ambulatory Visit (INDEPENDENT_AMBULATORY_CARE_PROVIDER_SITE_OTHER): Payer: Self-pay | Admitting: Family Medicine

## 2020-06-05 DIAGNOSIS — F3289 Other specified depressive episodes: Secondary | ICD-10-CM

## 2020-06-12 ENCOUNTER — Ambulatory Visit (INDEPENDENT_AMBULATORY_CARE_PROVIDER_SITE_OTHER): Payer: 59 | Admitting: Family Medicine

## 2020-06-12 ENCOUNTER — Other Ambulatory Visit: Payer: Self-pay

## 2020-06-12 ENCOUNTER — Encounter (INDEPENDENT_AMBULATORY_CARE_PROVIDER_SITE_OTHER): Payer: Self-pay | Admitting: Family Medicine

## 2020-06-12 ENCOUNTER — Other Ambulatory Visit (INDEPENDENT_AMBULATORY_CARE_PROVIDER_SITE_OTHER): Payer: Self-pay | Admitting: Family Medicine

## 2020-06-12 VITALS — BP 101/71 | HR 87 | Temp 98.2°F | Ht 63.0 in | Wt 189.0 lb

## 2020-06-12 DIAGNOSIS — Z9189 Other specified personal risk factors, not elsewhere classified: Secondary | ICD-10-CM

## 2020-06-12 DIAGNOSIS — F3289 Other specified depressive episodes: Secondary | ICD-10-CM | POA: Diagnosis not present

## 2020-06-12 DIAGNOSIS — R7303 Prediabetes: Secondary | ICD-10-CM

## 2020-06-12 DIAGNOSIS — E669 Obesity, unspecified: Secondary | ICD-10-CM | POA: Diagnosis not present

## 2020-06-12 DIAGNOSIS — E559 Vitamin D deficiency, unspecified: Secondary | ICD-10-CM

## 2020-06-12 DIAGNOSIS — Z6833 Body mass index (BMI) 33.0-33.9, adult: Secondary | ICD-10-CM

## 2020-06-12 MED ORDER — DESVENLAFAXINE SUCCINATE ER 50 MG PO TB24: 50.0000 mg | ORAL_TABLET | Freq: Every day | ORAL | 0 refills | Status: DC

## 2020-06-12 MED ORDER — TOPIRAMATE 50 MG PO TABS: 50.0000 mg | ORAL_TABLET | Freq: Every day | ORAL | 0 refills | Status: DC

## 2020-06-12 MED ORDER — INSULIN PEN NEEDLE 32G X 4 MM MISC: 0 refills | Status: DC

## 2020-06-12 MED ORDER — METFORMIN HCL 500 MG PO TABS: 500.0000 mg | ORAL_TABLET | Freq: Two times a day (BID) | ORAL | 0 refills | Status: DC

## 2020-06-12 MED ORDER — SAXENDA 18 MG/3ML ~~LOC~~ SOPN: 3.0000 mg | PEN_INJECTOR | Freq: Every day | SUBCUTANEOUS | 0 refills | Status: DC

## 2020-06-13 MED ORDER — VITAMIN D (ERGOCALCIFEROL) 1.25 MG (50000 UNIT) PO CAPS: ORAL_CAPSULE | ORAL | 0 refills | Status: DC

## 2020-06-13 MED FILL — VIT D2 1.25 MG (50,000 UNIT: 1.25 MG | 28 days supply | Qty: 4 | Fill #0

## 2020-06-14 MED FILL — ADDERALL XR 15 MG CAP SA: 15 | 30 days supply | Qty: 30 | Fill #0

## 2020-06-14 NOTE — Progress Notes (Signed)
Chief Complaint:   OBESITY Melissa [name] is here to discuss her progress with her obesity treatment plan along with follow-up of her obesity related diagnoses. Melissa [name] is on the Category 2 Plan and states she is following her eating plan approximately 50% of the time. Melissa [name] states she is doing 0 minutes 0 times per week.  Today's visit was #: 48 Starting weight: 219 lbs Starting date: 05/18/2019 Today's weight: 189 lbs Today's date: 06/12/2020 Total lbs lost to date: 30 Total lbs lost since last in-office visit: 1  Interim History: Melissa [name] continues to do well with weight loss on her Category 2 plan. Her hunger is controlled and she is tolerating her Saxenda well.  Subjective:   1. Pre-diabetes Melissa [name] is stable on metformin, and she denies nausea or vomiting. She is working on diet and exercise and is tolerating Saxenda well.  2. Vitamin D deficiency Melissa [name] is stable on Vit D, and she denies nausea, vomiting, or muscle weakness.  3. Other depression with emotional eating Melissa [name] is stable on medications, and she is doing well minimizing emotional eating.  4. At risk for nausea Melissa [name] is at risk for nausea due to [place].  Assessment/Plan:   1. Pre-diabetes Melissa [name] will continue to work on weight loss, exercise, and decreasing simple carbohydrates to help decrease the risk of diabetes. We will refill metformin for 1 month, and will continue to monitor.  - metFORMIN (GLUCOPHAGE) 500 MG tablet; Take 1 tablet (500 mg total) by mouth 2 (two) times daily with a meal.  Dispense: 60 tablet; Refill: 0  2. Vitamin D deficiency Low Vitamin D level contributes to fatigue and are associated with obesity, breast, and colon cancer. We will refill prescription Vitamin D for 1 month. Melissa [name] will follow-up for routine testing of Vitamin D, at least 2-3 times per year to avoid over-replacement.  - Vitamin D, Ergocalciferol, (DRISDOL) 1.25 MG (50000 UNIT) CAPS capsule; 1 capsule oral per week  Dispense: 4  capsule; Refill: 0  3. Other depression with emotional eating Behavior modification techniques were discussed today to help Melissa [name] deal with her emotional/non-hunger eating behaviors. We will refill Pristiq and Topamax for 1 month. Orders and follow up as documented in patient record.   - desvenlafaxine (PRISTIQ) 50 MG 24 hr tablet; Take 1 tablet (50 mg total) by mouth daily.  Dispense: 30 tablet; Refill: 0 - topiramate (TOPAMAX) 50 MG tablet; Take 1 tablet (50 mg total) by mouth daily.  Dispense: 30 tablet; Refill: 0  4. At risk for nausea Melissa [name] was given approximately 15 minutes of nausea prevention counseling today. Melissa [name] is at risk for nausea due to her new or current medication. She was encouraged to titrate her medication slowly, make sure to stay hydrated, eat smaller portions throughout the day, and avoid high fat meals.   5. Class 1 obesity with serious comorbidity and body mass index (BMI) of 33.0 to 33.9 in adult, unspecified obesity type Melissa [name] is currently in the action stage of change. As such, her goal is to continue with weight loss efforts. She has agreed to the Category 2 Plan.   We discussed various medication options to help Melissa [name] with her weight loss efforts and we both agreed to continue Saxenda 3 mg and we will refill for 1 month, and we will refill pen needles #100 with no refill.  - Liraglutide -Weight Management (SAXENDA) 18 MG/3ML SOPN; Inject 0.5 mLs (3 mg total) into the skin daily.  Dispense: 15 mL; Refill: 0 -  Insulin Pen Needle 32G X 4 MM MISC; Give injection once daily  Dispense: 100 each; Refill: 0  Behavioral modification strategies: increasing lean protein intake.  Melissa [name] has agreed to follow-up with our clinic in 3 weeks. She was informed of the importance of frequent follow-up visits to maximize her success with intensive lifestyle modifications for her multiple health conditions.   Objective:   Blood pressure 101/71, pulse 87, temperature 98.2 F  (36.8 C), temperature source Oral, height 5\' 3"  (1.6 m), weight 189 lb (85.7 kg), SpO2 99 %. Body mass index is 33.48 kg/m.  General: Cooperative, alert, well developed, in no acute distress. HEENT: Conjunctivae and lids unremarkable. Cardiovascular: Regular rhythm.  Lungs: Normal work of breathing. Neurologic: No focal deficits.   Lab Results  Component Value Date   CREATININE 0.79 05/22/2020   BUN 16 05/22/2020   NA 139 05/22/2020   K 4.5 05/22/2020   CL 105 05/22/2020   CO2 21 05/22/2020   Lab Results  Component Value Date   ALT 45 (H) 05/22/2020   AST 26 05/22/2020   ALKPHOS 81 05/22/2020   BILITOT 0.5 05/22/2020   Lab Results  Component Value Date   HGBA1C 5.7 (H) 05/22/2020   HGBA1C 5.7 (H) 02/21/2020   HGBA1C 5.6 10/19/2019   HGBA1C 6.1 (H) 05/18/2019   Lab Results  Component Value Date   INSULIN 15.5 05/22/2020   INSULIN 14.8 02/21/2020   INSULIN 13.3 10/19/2019   INSULIN 15.1 05/18/2019   Lab Results  Component Value Date   TSH 2.530 05/18/2019   Lab Results  Component Value Date   CHOL 182 05/22/2020   HDL 40 05/22/2020   LDLCALC 116 (H) 05/22/2020   TRIG 147 05/22/2020   Lab Results  Component Value Date   WBC 7.8 05/18/2019   HGB 15.6 05/18/2019   HCT 46.5 05/18/2019   MCV 92 05/18/2019   No results found for: IRON, TIBC, FERRITIN  Attestation Statements:   Reviewed by clinician on day of visit: allergies, medications, problem list, medical history, surgical history, family history, social history, and previous encounter notes.   I, Trixie Dredge, am acting as transcriptionist for Dennard Nip, MD.  I have reviewed the above documentation for accuracy and completeness, and I agree with the above. -  Dennard Nip, MD

## 2020-06-15 MED FILL — TOPIRAMATE 50 MG TABLET: 50 | 30 days supply | Qty: 30 | Fill #0

## 2020-06-19 ENCOUNTER — Other Ambulatory Visit (INDEPENDENT_AMBULATORY_CARE_PROVIDER_SITE_OTHER): Payer: Self-pay | Admitting: Family Medicine

## 2020-06-19 DIAGNOSIS — F3289 Other specified depressive episodes: Secondary | ICD-10-CM

## 2020-06-19 DIAGNOSIS — E559 Vitamin D deficiency, unspecified: Secondary | ICD-10-CM

## 2020-07-03 MED FILL — DESVENLAFAXINE SUC ER 50 MG: 50 | 30 days supply | Qty: 30 | Fill #0

## 2020-07-04 ENCOUNTER — Ambulatory Visit (INDEPENDENT_AMBULATORY_CARE_PROVIDER_SITE_OTHER): Payer: 59 | Admitting: Family Medicine

## 2020-07-12 ENCOUNTER — Other Ambulatory Visit: Payer: Self-pay | Admitting: Family

## 2020-07-12 DIAGNOSIS — Z1231 Encounter for screening mammogram for malignant neoplasm of breast: Secondary | ICD-10-CM

## 2020-07-18 ENCOUNTER — Ambulatory Visit (INDEPENDENT_AMBULATORY_CARE_PROVIDER_SITE_OTHER): Payer: 59 | Admitting: Family Medicine

## 2020-07-18 ENCOUNTER — Other Ambulatory Visit (INDEPENDENT_AMBULATORY_CARE_PROVIDER_SITE_OTHER): Payer: Self-pay | Admitting: Family Medicine

## 2020-07-18 ENCOUNTER — Encounter (INDEPENDENT_AMBULATORY_CARE_PROVIDER_SITE_OTHER): Payer: Self-pay | Admitting: Family Medicine

## 2020-07-18 ENCOUNTER — Other Ambulatory Visit: Payer: Self-pay

## 2020-07-18 VITALS — BP 103/67 | HR 94 | Temp 97.3°F | Ht 63.0 in | Wt 185.0 lb

## 2020-07-18 DIAGNOSIS — E559 Vitamin D deficiency, unspecified: Secondary | ICD-10-CM

## 2020-07-18 DIAGNOSIS — R7303 Prediabetes: Secondary | ICD-10-CM

## 2020-07-18 DIAGNOSIS — E669 Obesity, unspecified: Secondary | ICD-10-CM | POA: Diagnosis not present

## 2020-07-18 DIAGNOSIS — F3289 Other specified depressive episodes: Secondary | ICD-10-CM

## 2020-07-18 DIAGNOSIS — G4709 Other insomnia: Secondary | ICD-10-CM

## 2020-07-18 DIAGNOSIS — Z6832 Body mass index (BMI) 32.0-32.9, adult: Secondary | ICD-10-CM

## 2020-07-18 DIAGNOSIS — Z9189 Other specified personal risk factors, not elsewhere classified: Secondary | ICD-10-CM | POA: Diagnosis not present

## 2020-07-18 MED ORDER — TRAZODONE HCL 50 MG PO TABS: 25.0000 mg | ORAL_TABLET | Freq: Every evening | ORAL | 3 refills | Status: DC | PRN

## 2020-07-18 MED ORDER — INSULIN PEN NEEDLE 32G X 4 MM MISC: 0 refills | Status: DC

## 2020-07-18 MED ORDER — TOPIRAMATE 50 MG PO TABS: 50.0000 mg | ORAL_TABLET | Freq: Every day | ORAL | 0 refills | Status: DC

## 2020-07-18 MED ORDER — SAXENDA 18 MG/3ML ~~LOC~~ SOPN: 3.0000 mg | PEN_INJECTOR | Freq: Every day | SUBCUTANEOUS | 0 refills | Status: DC

## 2020-07-18 MED ORDER — DESVENLAFAXINE SUCCINATE ER 50 MG PO TB24: 50.0000 mg | ORAL_TABLET | Freq: Every day | ORAL | 0 refills | Status: DC

## 2020-07-18 MED ORDER — METFORMIN HCL 500 MG PO TABS: 500.0000 mg | ORAL_TABLET | Freq: Two times a day (BID) | ORAL | 0 refills | Status: DC

## 2020-07-18 MED ORDER — VITAMIN D (ERGOCALCIFEROL) 1.25 MG (50000 UNIT) PO CAPS: ORAL_CAPSULE | ORAL | 0 refills | Status: DC

## 2020-07-18 MED FILL — UNIFINE PENTIPS 32GX5/32: 32G X 4 MM | 90 days supply | Qty: 100 | Fill #0

## 2020-07-18 MED FILL — traZODone HCL 50 MG TABS: 50 | 30 days supply | Qty: 30 | Fill #0

## 2020-07-18 MED FILL — METFORMIN HCL 500 MG TABS: 500 | 30 days supply | Qty: 60 | Fill #0

## 2020-07-18 MED FILL — SAXENDA 18 MG/3 ML PEN: 18 | 30 days supply | Qty: 15 | Fill #0

## 2020-07-18 MED FILL — VIT D2 1.25 MG (50,000 UNIT: 1.25 MG | 28 days supply | Qty: 4 | Fill #0

## 2020-07-18 MED FILL — TOPIRAMATE 50 MG TABLET: 50 | 30 days supply | Qty: 30 | Fill #0

## 2020-07-18 NOTE — Progress Notes (Signed)
Chief Complaint:   OBESITY Melissa [name] is here to discuss her progress with her obesity treatment plan along with follow-up of her obesity related diagnoses. Melissa [name] is on the Category 2 Plan and states she is following her eating plan approximately 50% of the time. Melissa [name] states she is active at work, no intentional exercise 5 times per week.  Today's visit was #: 18 Starting weight: 219 lbs Starting date: 05/18/2019 Today's weight: 185 lbs Today's date: 07/18/2020 Total lbs lost to date: 34 Total lbs lost since last in-office visit: 4  Interim History: Melissa [name] continues to do well with weight loss on her Category 2 plan. She is note sleeping as well as ideal and she is struggling especially with PM meal planning.  Subjective:   1. Pre-diabetes Melissa [name] is stable on metformin and diet, and she denies nausea or vomiting. She is working on decreasing simple carbohydrates.  2. Vitamin D deficiency Melissa [name] is stable on Vit D, and she denies nausea or vomiting. She requests a refill today.  3. Other depression with emotional eating Melissa [name]'s mood is stable on her medications. She is doing well avoiding emotional eating.  4. Other insomnia Melissa [name] notes she is struggling to fall asleep and stay asleep. She denies improvement even with high dose melatonin.  5. At risk for heart disease Melissa [name] is at a higher than average risk for cardiovascular disease due to obesity.   Assessment/Plan:   1. Pre-diabetes Melissa [name] will continue to work on weight loss, diet, exercise, and decreasing simple carbohydrates to help decrease the risk of diabetes. We will refill metformin for 1 month.   - metFORMIN (GLUCOPHAGE) 500 MG tablet; Take 1 tablet (500 mg total) by mouth 2 (two) times daily with a meal.  Dispense: 60 tablet; Refill: 0  2. Vitamin D deficiency Low Vitamin D level contributes to fatigue and are associated with obesity, breast, and colon cancer. We will refill prescription Vitamin D for 1 month. Melissa [name]  will follow-up for routine testing of Vitamin D, at least 2-3 times per year to avoid over-replacement.  - Vitamin D, Ergocalciferol, (DRISDOL) 1.25 MG (50000 UNIT) CAPS capsule; 1 capsule oral per week  Dispense: 4 capsule; Refill: 0  3. Other depression with emotional eating Behavior modification techniques were discussed today to help Melissa [name] deal with her emotional/non-hunger eating behaviors. We will refill Topamax and Pristiq for 1 month, and will continue to follow closely. Orders and follow up as documented in patient record.   - desvenlafaxine (PRISTIQ) 50 MG 24 hr tablet; Take 1 tablet (50 mg total) by mouth daily.  Dispense: 30 tablet; Refill: 0 - topiramate (TOPAMAX) 50 MG tablet; Take 1 tablet (50 mg total) by mouth daily.  Dispense: 30 tablet; Refill: 0  4. Other insomnia The problem of recurrent insomnia was discussed. Orders and follow up as documented in patient record. Counseling: Intensive lifestyle modifications are the first line treatment for this issue. We discussed several lifestyle modifications today. Melissa [name] agreed to start trazodone 50 mg qhs #30 with no refills. She will continue to work on diet, exercise and weight loss efforts.   5. At risk for heart disease Melissa [name] was given approximately 15 minutes of coronary artery disease prevention counseling today. She is 49 y.o. female and has risk factors for heart disease including obesity. We discussed intensive lifestyle modifications today with an emphasis on specific weight loss instructions and strategies.   Repetitive spaced learning was employed today to elicit superior memory formation and behavioral change.  6. Class 1 obesity with serious comorbidity and body mass index (BMI) of 32.0 to 32.9 in adult, unspecified obesity type Melissa [name] is currently in the action stage of change. As such, her goal is to continue with weight loss efforts. She has agreed to the Category 2 Plan.   We discussed various medication options to  help Melissa [name] with her weight loss efforts and we both agreed to continue Saxenda and we will refill for 1 month, and will refill nano needles #100 with no refill.  - Insulin Pen Needle 32G X 4 MM MISC; Give injection once daily  Dispense: 100 each; Refill: 0 - Liraglutide -Weight Management (SAXENDA) 18 MG/3ML SOPN; Inject 3 mg into the skin daily.  Dispense: 15 mL; Refill: 0  Meal planning strategies were discussed.  Exercise goals: As is.  Behavioral modification strategies: meal planning and cooking strategies.  Melissa [name] has agreed to follow-up with our clinic in 4 weeks. She was informed of the importance of frequent follow-up visits to maximize her success with intensive lifestyle modifications for her multiple health conditions.   Objective:   Blood pressure 103/67, pulse 94, temperature (!) 97.3 F (36.3 C), height 5\' 3"  (1.6 m), weight 185 lb (83.9 kg), SpO2 97 %. Body mass index is 32.77 kg/m.  General: Cooperative, alert, well developed, in no acute distress. HEENT: Conjunctivae and lids unremarkable. Cardiovascular: Regular rhythm.  Lungs: Normal work of breathing. Neurologic: No focal deficits.   Lab Results  Component Value Date   CREATININE 0.79 05/22/2020   BUN 16 05/22/2020   NA 139 05/22/2020   K 4.5 05/22/2020   CL 105 05/22/2020   CO2 21 05/22/2020   Lab Results  Component Value Date   ALT 45 (H) 05/22/2020   AST 26 05/22/2020   ALKPHOS 81 05/22/2020   BILITOT 0.5 05/22/2020   Lab Results  Component Value Date   HGBA1C 5.7 (H) 05/22/2020   HGBA1C 5.7 (H) 02/21/2020   HGBA1C 5.6 10/19/2019   HGBA1C 6.1 (H) 05/18/2019   Lab Results  Component Value Date   INSULIN 15.5 05/22/2020   INSULIN 14.8 02/21/2020   INSULIN 13.3 10/19/2019   INSULIN 15.1 05/18/2019   Lab Results  Component Value Date   TSH 2.530 05/18/2019   Lab Results  Component Value Date   CHOL 182 05/22/2020   HDL 40 05/22/2020   LDLCALC 116 (H) 05/22/2020   TRIG 147 05/22/2020     Lab Results  Component Value Date   WBC 7.8 05/18/2019   HGB 15.6 05/18/2019   HCT 46.5 05/18/2019   MCV 92 05/18/2019   No results found for: IRON, TIBC, FERRITIN  Attestation Statements:   Reviewed by clinician on day of visit: allergies, medications, problem list, medical history, surgical history, family history, social history, and previous encounter notes.   I, Trixie Dredge, am acting as transcriptionist for Dennard Nip, MD.  I have reviewed the above documentation for accuracy and completeness, and I agree with the above. -  Dennard Nip, MD

## 2020-07-24 ENCOUNTER — Other Ambulatory Visit: Payer: Self-pay

## 2020-07-24 ENCOUNTER — Ambulatory Visit
Admission: RE | Admit: 2020-07-24 | Discharge: 2020-07-24 | Disposition: A | Discharge status: Home / Self Care | Payer: Self-pay | Source: Ambulatory Visit | Attending: Family | Admitting: Family

## 2020-07-24 DIAGNOSIS — Z1231 Encounter for screening mammogram for malignant neoplasm of breast: Secondary | ICD-10-CM

## 2020-08-07 MED FILL — DESVENLAFAXINE SUC ER 50 MG: 50 | 30 days supply | Qty: 30 | Fill #0

## 2020-08-15 ENCOUNTER — Other Ambulatory Visit: Payer: Self-pay

## 2020-08-15 ENCOUNTER — Encounter (INDEPENDENT_AMBULATORY_CARE_PROVIDER_SITE_OTHER): Payer: Self-pay | Admitting: Physician Assistant

## 2020-08-15 ENCOUNTER — Ambulatory Visit (INDEPENDENT_AMBULATORY_CARE_PROVIDER_SITE_OTHER): Payer: 59 | Admitting: Family Medicine

## 2020-08-15 ENCOUNTER — Telehealth (INDEPENDENT_AMBULATORY_CARE_PROVIDER_SITE_OTHER): Payer: 59 | Admitting: Physician Assistant

## 2020-08-15 DIAGNOSIS — E559 Vitamin D deficiency, unspecified: Secondary | ICD-10-CM

## 2020-08-15 DIAGNOSIS — F3289 Other specified depressive episodes: Secondary | ICD-10-CM

## 2020-08-15 DIAGNOSIS — E669 Obesity, unspecified: Secondary | ICD-10-CM | POA: Diagnosis not present

## 2020-08-15 DIAGNOSIS — Z6832 Body mass index (BMI) 32.0-32.9, adult: Secondary | ICD-10-CM | POA: Diagnosis not present

## 2020-08-15 DIAGNOSIS — R7303 Prediabetes: Secondary | ICD-10-CM | POA: Diagnosis not present

## 2020-08-16 ENCOUNTER — Other Ambulatory Visit (INDEPENDENT_AMBULATORY_CARE_PROVIDER_SITE_OTHER): Payer: Self-pay | Admitting: Physician Assistant

## 2020-08-16 MED ORDER — TOPIRAMATE 50 MG PO TABS: 50.0000 mg | ORAL_TABLET | Freq: Every day | ORAL | 0 refills | Status: DC

## 2020-08-16 MED ORDER — VITAMIN D (ERGOCALCIFEROL) 1.25 MG (50000 UNIT) PO CAPS: ORAL_CAPSULE | ORAL | 0 refills | Status: DC

## 2020-08-16 MED ORDER — METFORMIN HCL 500 MG PO TABS
500.0000 mg | ORAL_TABLET | Freq: Two times a day (BID) | ORAL | 0 refills | Status: DC
Start: 2020-08-16 — End: 2020-12-25

## 2020-08-16 MED FILL — VIT D2 1.25 MG (50,000 UNIT: 1.25 MG | 28 days supply | Qty: 4 | Fill #0

## 2020-08-16 MED FILL — METFORMIN HCL 500 MG TABS: 500 | 30 days supply | Qty: 60 | Fill #0

## 2020-08-16 MED FILL — TOPIRAMATE 50 MG TABLET: 50 | 30 days supply | Qty: 30 | Fill #0

## 2020-08-16 NOTE — Progress Notes (Signed)
TeleHealth Visit:  Due to the COVID-19 pandemic, this visit was completed with telemedicine (audio/video) technology to reduce patient and provider exposure as well as to preserve personal protective equipment.   [name] has verbally consented to this TeleHealth visit. The patient is located at home, the provider is located at the [facility] office. The participants in this visit include the listed provider and patient. The visit was conducted today via video.  Chief Complaint: OBESITY Melissa [name] is here to discuss her progress with her obesity treatment plan along with follow-up of her obesity related diagnoses. Melissa [name] is on the Category 2 Plan and states she is following her eating plan approximately 50% of the time. Lavelle states she is walking 10-30 minutes 3 times per week.  Today's visit was #: 46 Starting weight: 219 lbs Starting date: 05/18/2019  Interim History: Melissa [name] states that she was slightly off track last week, but has been working to get back on track this week. She is taking Saxenda 2.1 mg daily since yesterday and is tolerating it well. She struggles the most with dinner as she does not like to cook.  Subjective:   Vitamin D deficiency. Melissa [name] is on prescription Vitamin D weekly. She is walking outside a few times weekly.   Ref. Range 05/22/2020 14:39  Vitamin D, 25-Hydroxy Latest Ref Range: 30.0 - 100.0 ng/mL 56.3   Pre-diabetes. Melissa [name] has a diagnosis of prediabetes based on her elevated HgA1c and was informed this puts her at greater risk of developing diabetes. She continues to work on diet and exercise to decrease her risk of diabetes. She denies nausea or hypoglycemia. Melissa [name] reports intermittent polyphagia. She is on metformin twice weekly.  Lab Results  Component Value Date   HGBA1C 5.7 (H) 05/22/2020   Lab Results  Component Value Date   INSULIN 15.5 05/22/2020   INSULIN 14.8 02/21/2020   INSULIN 13.3 10/19/2019   INSULIN 15.1 05/18/2019    Other depression with emotional eating. Melissa [name] is struggling with emotional eating and using food for comfort to the extent that it is negatively impacting her health. She has been working on behavior modification techniques to help reduce her emotional eating and has been somewhat successful. She shows no sign of suicidal or homicidal ideations. Melissa [name] is on Topamax, which she reports helps with her cravings.  Assessment/Plan:   Vitamin D deficiency. Low Vitamin D level contributes to fatigue and are associated with obesity, breast, and colon cancer. She was given a refill on her Vitamin D, Ergocalciferol, (DRISDOL) 1.25 MG (50000 UNIT) CAPS capsule every week #4 with 0 refills and will follow-up for routine testing of Vitamin D, at least 2-3 times per year to avoid over-replacement.   Pre-diabetes. Tailer will continue to work on weight loss, exercise, and decreasing simple carbohydrates to help decrease the risk of diabetes. Refill was given for metFORMIN (GLUCOPHAGE) 500 MG tablet #60 with 0 refills.  Other depression with emotional eating. Behavior modification techniques were discussed today to help Melissa [name] deal with her emotional/non-hunger eating behaviors.  Orders and follow up as documented in patient record. Refill was given for topiramate (TOPAMAX) 50 MG tablet #30 with 0 refills.  Class 1 obesity with serious comorbidity and body mass index (BMI) of 32.0 to 32.9 in adult, unspecified obesity type.  Melissa [name] is currently in the action stage of change. As such, her goal is to continue with weight loss efforts. She has agreed to the Category 2 Plan and will journal 1200-1400 calories  and 85 grams of protein daily.   Exercise goals: For substantial health benefits, adults should do at least 150 minutes (2 hours and 30 minutes) a week of moderate-intensity, or 75 minutes (1 hour and 15 minutes) a week of vigorous-intensity aerobic physical activity, or an equivalent combination of moderate- and  vigorous-intensity aerobic activity. Aerobic activity should be performed in episodes of at least 10 minutes, and preferably, it should be spread throughout the week.  Behavioral modification strategies: meal planning and cooking strategies and keeping a strict food journal.  Melissa [name] has agreed to follow-up with our clinic in 2 weeks. She was informed of the importance of frequent follow-up visits to maximize her success with intensive lifestyle modifications for her multiple health conditions.  Objective:   VITALS: Per patient if applicable, see vitals. GENERAL: Alert and in no acute distress. CARDIOPULMONARY: No increased WOB. Speaking in clear sentences.  PSYCH: Pleasant and cooperative. Speech normal rate and rhythm. Affect is appropriate. Insight and judgement are appropriate. Attention is focused, linear, and appropriate.  NEURO: Oriented as arrived to appointment on time with no prompting.   Lab Results  Component Value Date   CREATININE 0.79 05/22/2020   BUN 16 05/22/2020   NA 139 05/22/2020   K 4.5 05/22/2020   CL 105 05/22/2020   CO2 21 05/22/2020   Lab Results  Component Value Date   ALT 45 (H) 05/22/2020   AST 26 05/22/2020   ALKPHOS 81 05/22/2020   BILITOT 0.5 05/22/2020   Lab Results  Component Value Date   HGBA1C 5.7 (H) 05/22/2020   HGBA1C 5.7 (H) 02/21/2020   HGBA1C 5.6 10/19/2019   HGBA1C 6.1 (H) 05/18/2019   Lab Results  Component Value Date   INSULIN 15.5 05/22/2020   INSULIN 14.8 02/21/2020   INSULIN 13.3 10/19/2019   INSULIN 15.1 05/18/2019   Lab Results  Component Value Date   TSH 2.530 05/18/2019   Lab Results  Component Value Date   CHOL 182 05/22/2020   HDL 40 05/22/2020   LDLCALC 116 (H) 05/22/2020   TRIG 147 05/22/2020   Lab Results  Component Value Date   WBC 7.8 05/18/2019   HGB 15.6 05/18/2019   HCT 46.5 05/18/2019   MCV 92 05/18/2019   No results found for: IRON, TIBC, FERRITIN  Attestation Statements:   Reviewed by  clinician on day of visit: allergies, medications, problem list, medical history, surgical history, family history, social history, and previous encounter notes.  IMichaelene Song, am acting as transcriptionist for Abby Potash, PA-C   I have reviewed the above documentation for accuracy and completeness, and I agree with the above. Abby Potash, PA-C

## 2020-08-18 ENCOUNTER — Other Ambulatory Visit (INDEPENDENT_AMBULATORY_CARE_PROVIDER_SITE_OTHER): Payer: Self-pay | Admitting: Family Medicine

## 2020-08-18 DIAGNOSIS — R7303 Prediabetes: Secondary | ICD-10-CM

## 2020-08-18 DIAGNOSIS — F3289 Other specified depressive episodes: Secondary | ICD-10-CM

## 2020-08-30 ENCOUNTER — Ambulatory Visit (INDEPENDENT_AMBULATORY_CARE_PROVIDER_SITE_OTHER): Payer: 59 | Admitting: Family Medicine

## 2020-08-30 ENCOUNTER — Encounter (INDEPENDENT_AMBULATORY_CARE_PROVIDER_SITE_OTHER): Payer: Self-pay | Admitting: Family Medicine

## 2020-08-30 ENCOUNTER — Other Ambulatory Visit: Payer: Self-pay

## 2020-08-30 VITALS — BP 114/74 | HR 75 | Temp 97.5°F | Ht 63.0 in | Wt 183.0 lb

## 2020-08-30 DIAGNOSIS — Z9189 Other specified personal risk factors, not elsewhere classified: Secondary | ICD-10-CM

## 2020-08-30 DIAGNOSIS — E559 Vitamin D deficiency, unspecified: Secondary | ICD-10-CM

## 2020-08-30 DIAGNOSIS — Z6832 Body mass index (BMI) 32.0-32.9, adult: Secondary | ICD-10-CM

## 2020-08-30 DIAGNOSIS — F3289 Other specified depressive episodes: Secondary | ICD-10-CM

## 2020-08-30 DIAGNOSIS — E669 Obesity, unspecified: Secondary | ICD-10-CM

## 2020-08-31 ENCOUNTER — Other Ambulatory Visit (INDEPENDENT_AMBULATORY_CARE_PROVIDER_SITE_OTHER): Payer: Self-pay | Admitting: Family Medicine

## 2020-08-31 MED ORDER — SAXENDA 18 MG/3ML ~~LOC~~ SOPN: 3.0000 mg | PEN_INJECTOR | Freq: Every day | SUBCUTANEOUS | 0 refills | Status: DC

## 2020-08-31 MED ORDER — TOPIRAMATE 50 MG PO TABS: 50.0000 mg | ORAL_TABLET | Freq: Every day | ORAL | 0 refills | Status: DC

## 2020-08-31 MED ORDER — VITAMIN D (ERGOCALCIFEROL) 1.25 MG (50000 UNIT) PO CAPS: ORAL_CAPSULE | ORAL | 0 refills | Status: DC

## 2020-08-31 NOTE — Progress Notes (Signed)
Chief Complaint:   OBESITY Melissa [name] is here to discuss her progress with her obesity treatment plan along with follow-up of her obesity related diagnoses. Melissa [name] is on the Category 2 Plan or keeping a food journal and adhering to recommended goals of 1200-1400 calories and 85 grams of protein daily and states she is following her eating plan approximately 50% of the time. Melissa [name] states she is walking for 15 minutes 7 times per week.  Today's visit was #: 20 Starting weight: 219 lbs Starting date: 05/18/2019 Today's weight: 183 lbs Today's date: 08/30/2020 Total lbs lost to date: 36 Total lbs lost since last in-office visit: 2  Interim History: Melissa [name] continues to do well with her weight loss efforts. She has struggled more in the last few days. She increase Saxenda to 2.4 mg and denies nausea or vomiting.  Subjective:   1. Vitamin D deficiency Melissa [name] is stable on Vit D, and she requests a refill today.  2. Other depression with emotional eating Melissa [name] is stable on Topamax, and she is doing well with minimizing emotional eating. No panesthesia noted.  3. At risk for nausea Melissa [name] is at risk for nausea due to increased Saxenda.  Assessment/Plan:   1. Vitamin D deficiency Low Vitamin D level contributes to fatigue and are associated with obesity, breast, and colon cancer. We will refill prescription Vitamin D for 1 month. Melissa [name] will follow-up for routine testing of Vitamin D, at least 2-3 times per year to avoid over-replacement.  - Vitamin D, Ergocalciferol, (DRISDOL) 1.25 MG (50000 UNIT) CAPS capsule; 1 capsule oral per week  Dispense: 4 capsule; Refill: 0  2. Other depression with emotional eating Behavior modification techniques were discussed today to help Melissa [name] deal with her emotional/non-hunger eating behaviors. We will refill Topamax, will continue to monitor closely. Orders and follow up as documented in patient record.   - topiramate (TOPAMAX) 50 MG tablet; Take 1 tablet (50  mg total) by mouth daily.  Dispense: 30 tablet; Refill: 0  3. At risk for nausea Melissa [name] was given approximately 15 minutes of nausea prevention counseling today. Melissa [name] is at risk for nausea due to her new or current medication. She was encouraged to titrate her medication slowly, make sure to stay hydrated, eat smaller portions throughout the day, and avoid high fat meals.   4. Class 1 obesity with serious comorbidity and body mass index (BMI) of 32.0 to 32.9 in adult, unspecified obesity type Melissa [name] is currently in the action stage of change. As such, her goal is to continue with weight loss efforts. She has agreed to the Category 2 Plan.   We discussed various medication options to help Melissa [name] with her weight loss efforts and we both agreed to continue Melissa [name] and we will refill for 1 month.  - Liraglutide -Weight Management (SAXENDA) 18 MG/3ML SOPN; Inject 3 mg into the skin daily.  Dispense: 15 mL; Refill: 0  Exercise goals: As is.  Behavioral modification strategies: increasing lean protein intake and meal planning and cooking strategies.  Melissa [name] has agreed to follow-up with our clinic in 3 to 4 weeks. She was informed of the importance of frequent follow-up visits to maximize her success with intensive lifestyle modifications for her multiple health conditions.   Objective:   Blood pressure 114/74, pulse 75, temperature (!) 97.5 F (36.4 C), height 5\' 3"  (1.6 m), weight 183 lb (83 kg), SpO2 99 %. Body mass index is 32.42 kg/m.  General: Cooperative, alert, well developed, in no  acute distress. HEENT: Conjunctivae and lids unremarkable. Cardiovascular: Regular rhythm.  Lungs: Normal work of breathing. Neurologic: No focal deficits.   Lab Results  Component Value Date   CREATININE 0.79 05/22/2020   BUN 16 05/22/2020   NA 139 05/22/2020   K 4.5 05/22/2020   CL 105 05/22/2020   CO2 21 05/22/2020   Lab Results  Component Value Date   ALT 45 (H) 05/22/2020   AST 26  05/22/2020   ALKPHOS 81 05/22/2020   BILITOT 0.5 05/22/2020   Lab Results  Component Value Date   HGBA1C 5.7 (H) 05/22/2020   HGBA1C 5.7 (H) 02/21/2020   HGBA1C 5.6 10/19/2019   HGBA1C 6.1 (H) 05/18/2019   Lab Results  Component Value Date   INSULIN 15.5 05/22/2020   INSULIN 14.8 02/21/2020   INSULIN 13.3 10/19/2019   INSULIN 15.1 05/18/2019   Lab Results  Component Value Date   TSH 2.530 05/18/2019   Lab Results  Component Value Date   CHOL 182 05/22/2020   HDL 40 05/22/2020   LDLCALC 116 (H) 05/22/2020   TRIG 147 05/22/2020   Lab Results  Component Value Date   WBC 7.8 05/18/2019   HGB 15.6 05/18/2019   HCT 46.5 05/18/2019   MCV 92 05/18/2019   No results found for: IRON, TIBC, FERRITIN  Attestation Statements:   Reviewed by clinician on day of visit: allergies, medications, problem list, medical history, surgical history, family history, social history, and previous encounter notes.   I, Trixie Dredge, am acting as transcriptionist for Dennard Nip, MD.  I have reviewed the above documentation for accuracy and completeness, and I agree with the above. -  Dennard Nip, MD

## 2020-09-01 MED FILL — SAXENDA 18 MG/3 ML PEN: 18 | 30 days supply | Qty: 15 | Fill #0

## 2020-09-04 ENCOUNTER — Other Ambulatory Visit (HOSPITAL_COMMUNITY): Payer: Self-pay

## 2020-09-04 ENCOUNTER — Encounter (INDEPENDENT_AMBULATORY_CARE_PROVIDER_SITE_OTHER): Payer: Self-pay

## 2020-09-04 ENCOUNTER — Other Ambulatory Visit (INDEPENDENT_AMBULATORY_CARE_PROVIDER_SITE_OTHER): Payer: Self-pay | Admitting: Family Medicine

## 2020-09-04 DIAGNOSIS — F900 Attention-deficit hyperactivity disorder, predominantly inattentive type: Secondary | ICD-10-CM | POA: Diagnosis not present

## 2020-09-04 DIAGNOSIS — F3289 Other specified depressive episodes: Secondary | ICD-10-CM

## 2020-09-04 DIAGNOSIS — F3341 Major depressive disorder, recurrent, in partial remission: Secondary | ICD-10-CM | POA: Diagnosis not present

## 2020-09-04 MED FILL — DESVENLAFAXINE SUC ER 50 MG: 50 | 90 days supply | Qty: 90 | Fill #0

## 2020-09-04 NOTE — Telephone Encounter (Signed)
Dr Beasley pt 

## 2020-09-04 NOTE — Telephone Encounter (Signed)
MyChart message sent to pt to find out if they have enough medication to get them through until next appt.   

## 2020-09-19 MED FILL — VIT D2 1.25 MG (50,000 UNIT: 1.25 MG | 28 days supply | Qty: 4 | Fill #0

## 2020-09-19 MED FILL — TOPIRAMATE 50 MG TABLET: 50 | 30 days supply | Qty: 30 | Fill #0

## 2020-09-20 ENCOUNTER — Ambulatory Visit (INDEPENDENT_AMBULATORY_CARE_PROVIDER_SITE_OTHER): Payer: 59 | Admitting: Family Medicine

## 2020-09-21 ENCOUNTER — Other Ambulatory Visit (HOSPITAL_COMMUNITY): Payer: Self-pay

## 2020-09-21 MED FILL — ADDERALL XR 15 MG CAP SA: 15 | 30 days supply | Qty: 30 | Fill #0

## 2020-09-25 ENCOUNTER — Other Ambulatory Visit: Payer: Self-pay

## 2020-09-25 ENCOUNTER — Ambulatory Visit (INDEPENDENT_AMBULATORY_CARE_PROVIDER_SITE_OTHER): Payer: 59 | Admitting: Family Medicine

## 2020-09-25 ENCOUNTER — Encounter (INDEPENDENT_AMBULATORY_CARE_PROVIDER_SITE_OTHER): Payer: Self-pay | Admitting: Family Medicine

## 2020-09-25 ENCOUNTER — Other Ambulatory Visit (INDEPENDENT_AMBULATORY_CARE_PROVIDER_SITE_OTHER): Payer: Self-pay | Admitting: Family Medicine

## 2020-09-25 VITALS — BP 125/76 | HR 83 | Temp 97.6°F | Ht 63.0 in | Wt 185.0 lb

## 2020-09-25 DIAGNOSIS — E559 Vitamin D deficiency, unspecified: Secondary | ICD-10-CM | POA: Diagnosis not present

## 2020-09-25 DIAGNOSIS — F3289 Other specified depressive episodes: Secondary | ICD-10-CM

## 2020-09-25 DIAGNOSIS — Z9189 Other specified personal risk factors, not elsewhere classified: Secondary | ICD-10-CM

## 2020-09-25 DIAGNOSIS — E669 Obesity, unspecified: Secondary | ICD-10-CM | POA: Diagnosis not present

## 2020-09-25 DIAGNOSIS — Z6832 Body mass index (BMI) 32.0-32.9, adult: Secondary | ICD-10-CM

## 2020-09-25 MED ORDER — DESVENLAFAXINE SUCCINATE ER 50 MG PO TB24: 50.0000 mg | ORAL_TABLET | Freq: Every day | ORAL | 0 refills | Status: DC

## 2020-09-25 MED ORDER — TOPIRAMATE 50 MG PO TABS: 50.0000 mg | ORAL_TABLET | Freq: Every day | ORAL | 0 refills | Status: DC

## 2020-09-25 MED ORDER — VITAMIN D (ERGOCALCIFEROL) 1.25 MG (50000 UNIT) PO CAPS: ORAL_CAPSULE | ORAL | 0 refills | Status: DC

## 2020-09-26 ENCOUNTER — Other Ambulatory Visit (INDEPENDENT_AMBULATORY_CARE_PROVIDER_SITE_OTHER): Payer: Self-pay | Admitting: Family Medicine

## 2020-09-26 MED ORDER — SAXENDA 18 MG/3ML ~~LOC~~ SOPN: 3.0000 mg | PEN_INJECTOR | Freq: Every day | SUBCUTANEOUS | 0 refills | Status: DC

## 2020-09-26 MED FILL — SAXENDA 18 MG/3 ML PEN: 18 | 30 days supply | Qty: 15 | Fill #0

## 2020-09-26 NOTE — Progress Notes (Signed)
Chief Complaint:   OBESITY Melissa [name] is here to discuss her progress with her obesity treatment plan along with follow-up of her obesity related diagnoses. Melissa [name] is on the Category 2 Plan and states she is following her eating plan approximately 50-75% of the time. Melissa [name] states she is walking and swimming for 15 minutes 3-4 times per week.  Today's visit was #: 21 Starting weight: 219 lbs Starting date: 05/18/2019 Today's weight: 185 lbs Today's date: 09/25/2020 Total lbs lost to date: 34 Total lbs lost since last in-office visit: 0  Interim History: [place]lee is retaining some water today, and has otherwise done well maintaining her weight. She is willing to discuss Thanksgiving day eating strategies to help avoiding holiday weight gain.  Subjective:   1. Vitamin D deficiency Melissa [name] is stable on Vit D, and she is due for labs soon.  2. Other depression with emotional eating Melissa [name] mood stable on medications. She is dealing with increased work stress and frustration, but appears to be working on [employer].  3. At risk for hypoglycemia Melissa [name] is at increased risk for hypoglycemia due to changes in diet, diagnosis of diabetes, and/or insulin use.   Assessment/Plan:   1. Vitamin D deficiency Low Vitamin D level contributes to fatigue and are associated with obesity, breast, and colon cancer. We will refill prescription Vitamin D for 1 month. We will recheck labs in 3 months. Melissa [name] will follow-up for routine testing of Vitamin D, at least 2-3 times per year to avoid over-replacement.  - Vitamin D, Ergocalciferol, (DRISDOL) 1.25 MG (50000 UNIT) CAPS capsule; 1 capsule oral per week  Dispense: 4 capsule; Refill: 0  2. Other depression with emotional eating Behavior modification techniques were discussed today to help Melissa [name] deal with her emotional/non-hunger eating behaviors. We will refill Pristiq and Topamax for 1 month. Orders and follow up as documented in patient record.    - topiramate (TOPAMAX) 50 MG tablet; Take 1 tablet (50 mg total) by mouth daily.  Dispense: 30 tablet; Refill: 0 - desvenlafaxine (PRISTIQ) 50 MG 24 hr tablet; Take 1 tablet (50 mg total) by mouth daily.  Dispense: 30 tablet; Refill: 0  3. At risk for hypoglycemia Melissa [name] was given approximately 15 minutes of counseling today regarding prevention of hypoglycemia. She was advised of symptoms of hypoglycemia. Melissa [name] was instructed to avoid skipping meals, eat regular protein rich meals and schedule low calorie snacks as needed.   Repetitive spaced learning was employed today to elicit superior memory formation and behavioral change  4. Class 1 obesity with serious comorbidity and body mass index (BMI) of 32.0 to 32.9 in adult, unspecified obesity type Melissa [name] is currently in the action stage of change. As such, her goal is to continue with weight loss efforts. She has agreed to the Category 2 Plan.   We discussed various medication options to help Melissa [name] with her weight loss efforts and we both agreed to continue Saxenda 3 mg SubQ daily and we will refill for 1 month.  Exercise goals: As is.  Behavioral modification strategies: increasing lean protein intake and holiday eating strategies .  Melissa [name] has agreed to follow-up with our clinic in 3 weeks. She was informed of the importance of frequent follow-up visits to maximize her success with intensive lifestyle modifications for her multiple health conditions.   Objective:   Blood pressure 125/76, pulse 83, temperature 97.6 F (36.4 C), height 5\' 3"  (1.6 m), weight 185 lb (83.9 kg), SpO2 98 %. Body mass index  is 32.77 kg/m.  General: Cooperative, alert, well developed, in no acute distress. HEENT: Conjunctivae and lids unremarkable. Cardiovascular: Regular rhythm.  Lungs: Normal work of breathing. Neurologic: No focal deficits.   Lab Results  Component Value Date   CREATININE 0.79 05/22/2020   BUN 16 05/22/2020   NA 139 05/22/2020   K  4.5 05/22/2020   CL 105 05/22/2020   CO2 21 05/22/2020   Lab Results  Component Value Date   ALT 45 (H) 05/22/2020   AST 26 05/22/2020   ALKPHOS 81 05/22/2020   BILITOT 0.5 05/22/2020   Lab Results  Component Value Date   HGBA1C 5.7 (H) 05/22/2020   HGBA1C 5.7 (H) 02/21/2020   HGBA1C 5.6 10/19/2019   HGBA1C 6.1 (H) 05/18/2019   Lab Results  Component Value Date   INSULIN 15.5 05/22/2020   INSULIN 14.8 02/21/2020   INSULIN 13.3 10/19/2019   INSULIN 15.1 05/18/2019   Lab Results  Component Value Date   TSH 2.530 05/18/2019   Lab Results  Component Value Date   CHOL 182 05/22/2020   HDL 40 05/22/2020   LDLCALC 116 (H) 05/22/2020   TRIG 147 05/22/2020   Lab Results  Component Value Date   WBC 7.8 05/18/2019   HGB 15.6 05/18/2019   HCT 46.5 05/18/2019   MCV 92 05/18/2019   No results found for: IRON, TIBC, FERRITIN  Attestation Statements:   Reviewed by clinician on day of visit: allergies, medications, problem list, medical history, surgical history, family history, social history, and previous encounter notes.   I, Trixie Dredge, am acting as transcriptionist for Dennard Nip, MD.  I have reviewed the above documentation for accuracy and completeness, and I agree with the above. -  Dennard Nip, MD

## 2020-10-09 MED FILL — METFORMIN HCL 500 MG TABS: 500 | 30 days supply | Qty: 60 | Fill #0

## 2020-10-13 MED FILL — VIT D2 1.25 MG (50,000 UNIT: 1.25 MG | 28 days supply | Qty: 4 | Fill #0

## 2020-10-13 MED FILL — TOPIRAMATE 50 MG TABLET: 50 | 30 days supply | Qty: 30 | Fill #0

## 2020-10-18 ENCOUNTER — Other Ambulatory Visit: Payer: Self-pay

## 2020-10-18 ENCOUNTER — Encounter (INDEPENDENT_AMBULATORY_CARE_PROVIDER_SITE_OTHER): Payer: Self-pay | Admitting: Family Medicine

## 2020-10-18 ENCOUNTER — Ambulatory Visit (INDEPENDENT_AMBULATORY_CARE_PROVIDER_SITE_OTHER): Payer: 59 | Admitting: Family Medicine

## 2020-10-18 ENCOUNTER — Other Ambulatory Visit (INDEPENDENT_AMBULATORY_CARE_PROVIDER_SITE_OTHER): Payer: Self-pay | Admitting: Family Medicine

## 2020-10-18 VITALS — BP 116/73 | HR 78 | Temp 97.7°F | Ht 63.0 in | Wt 185.0 lb

## 2020-10-18 DIAGNOSIS — Z6832 Body mass index (BMI) 32.0-32.9, adult: Secondary | ICD-10-CM

## 2020-10-18 DIAGNOSIS — E669 Obesity, unspecified: Secondary | ICD-10-CM

## 2020-10-18 DIAGNOSIS — E7849 Other hyperlipidemia: Secondary | ICD-10-CM | POA: Diagnosis not present

## 2020-10-18 DIAGNOSIS — F3289 Other specified depressive episodes: Secondary | ICD-10-CM | POA: Diagnosis not present

## 2020-10-18 DIAGNOSIS — Z9189 Other specified personal risk factors, not elsewhere classified: Secondary | ICD-10-CM

## 2020-10-18 DIAGNOSIS — E559 Vitamin D deficiency, unspecified: Secondary | ICD-10-CM

## 2020-10-18 DIAGNOSIS — R7303 Prediabetes: Secondary | ICD-10-CM | POA: Diagnosis not present

## 2020-10-18 MED ORDER — SAXENDA 18 MG/3ML ~~LOC~~ SOPN: 3.0000 mg | PEN_INJECTOR | Freq: Every day | SUBCUTANEOUS | 0 refills | Status: DC

## 2020-10-18 MED ORDER — TOPIRAMATE 50 MG PO TABS: 50.0000 mg | ORAL_TABLET | Freq: Every day | ORAL | 0 refills | Status: DC

## 2020-10-18 MED ORDER — VITAMIN D (ERGOCALCIFEROL) 1.25 MG (50000 UNIT) PO CAPS: ORAL_CAPSULE | ORAL | 0 refills | Status: DC

## 2020-10-18 MED ORDER — DESVENLAFAXINE SUCCINATE ER 50 MG PO TB24: 50.0000 mg | ORAL_TABLET | Freq: Every day | ORAL | 0 refills | Status: DC

## 2020-10-18 NOTE — Progress Notes (Signed)
Chief Complaint:   OBESITY Melissa [name] is here to discuss her progress with her obesity treatment plan along with follow-up of her obesity related diagnoses. Melissa [name] is on the Category 2 Plan and states she is following her eating plan approximately 50% of the time. Melissa [name] states she is doing 0 minutes 0 times per week.  Today's visit was #: 22 Starting weight: 219 lbs Starting date: 05/18/2019 Today's weight: 185 lbs Today's date: 10/18/2020 Total lbs lost to date: 34 Total lbs lost since last in-office visit: 0  Interim History: Melissa [name] continues to do well with maintaining her weight loss even over Thanksgiving. She feels she will be able to damage control the celebrations eating over Christmas and she is doing well with trying to increase protein.  Subjective:   1. Vitamin D deficiency Melissa [name] is due for labs, and she is doing well on her diet.  2. Pre-diabetes Melissa [name] continues to work on diet and exercise, and she is due for labs.  3. Other hyperlipidemia Melissa [name] is due to have labs today. She denies chest pain, and she is working on her diet.  4. Other depression with emotional eating Melissa [name]'s mood is stable on her medications, and she continues to work on minimizing emotional eating behaviors.  5. At risk for activity intolerance Melissa [name] is at risk for exercise intolerance due to not exercising.  Assessment/Plan:   1. Vitamin D deficiency Low Vitamin D level contributes to fatigue and are associated with obesity, breast, and colon cancer. We will check labs today and we will refill prescription Vitamin D for 1 month. Melissa [name] will follow-up for routine testing of Vitamin D, at least 2-3 times per year to avoid over-replacement.  - VITAMIN D 25 Hydroxy (Vit-D Deficiency, Fractures) - Vitamin D, Ergocalciferol, (DRISDOL) 1.25 MG (50000 UNIT) CAPS capsule; 1 capsule oral per week  Dispense: 4 capsule; Refill: 0  2. Pre-diabetes Melissa [name] will continue metformin, and will continue to work on  weight loss, exercise, and decreasing simple carbohydrates to help decrease the risk of diabetes. We will check labs today.  - Comprehensive metabolic panel - Hemoglobin A1c - Insulin, random  3. Other hyperlipidemia Cardiovascular risk and specific lipid/LDL goals reviewed. We discussed several lifestyle modifications today. Annora will continue to work on diet, exercise and weight loss efforts. We will check labs today. Orders and follow up as documented in patient record.   - Lipid Panel With LDL/HDL Ratio  4. Other depression with emotional eating Behavior modification techniques were discussed today to help Melissa [name] deal with her emotional/non-hunger eating behaviors. We will refill both Topamax and Pristiq for 1 month. Orders and follow up as documented in patient record.   - topiramate (TOPAMAX) 50 MG tablet; Take 1 tablet (50 mg total) by mouth daily.  Dispense: 30 tablet; Refill: 0 - desvenlafaxine (PRISTIQ) 50 MG 24 hr tablet; Take 1 tablet (50 mg total) by mouth daily.  Dispense: 30 tablet; Refill: 0  5. At risk for activity intolerance Melissa [name] was given approximately 15 minutes of exercise intolerance counseling today. She is 49 y.o. female and has risk factors exercise intolerance including obesity. We discussed intensive lifestyle modifications today with an emphasis on specific weight loss instructions and strategies. Melissa [name] will plan on increasing exercise in January.  Repetitive spaced learning was employed today to elicit superior memory formation and behavioral change.  6. Class 1 obesity with serious comorbidity and body mass index (BMI) of 32.0 to 32.9 in adult, unspecified obesity type Melissa [name] is currently  in the action stage of change. As such, her goal is to continue with weight loss efforts. She has agreed to the Category 2 Plan.   We discussed various medication options to help Melissa [name] with her weight loss efforts and we both agreed to continue Saxenda, and we will refill for  1 month.  - Liraglutide -Weight Management (SAXENDA) 18 MG/3ML SOPN; Inject 3 mg into the skin daily.  Dispense: 15 mL; Refill: 0  Behavioral modification strategies: increasing lean protein intake and holiday eating strategies .  Melissa [name] has agreed to follow-up with our clinic in 3 to 4 weeks. She was informed of the importance of frequent follow-up visits to maximize her success with intensive lifestyle modifications for her multiple health conditions.   Melissa [name] was informed we would discuss her lab results at her next visit unless there is a critical issue that needs to be addressed sooner. Melissa [name] agreed to keep her next visit at the agreed upon time to discuss these results.  Objective:   Blood pressure 116/73, pulse 78, temperature 97.7 F (36.5 C), height 5\' 3"  (1.6 m), weight 185 lb (83.9 kg), SpO2 98 %. Body mass index is 32.77 kg/m.  General: Cooperative, alert, well developed, in no acute distress. HEENT: Conjunctivae and lids unremarkable. Cardiovascular: Regular rhythm.  Lungs: Normal work of breathing. Neurologic: No focal deficits.   Lab Results  Component Value Date   CREATININE 0.79 05/22/2020   BUN 16 05/22/2020   NA 139 05/22/2020   K 4.5 05/22/2020   CL 105 05/22/2020   CO2 21 05/22/2020   Lab Results  Component Value Date   ALT 45 (H) 05/22/2020   AST 26 05/22/2020   ALKPHOS 81 05/22/2020   BILITOT 0.5 05/22/2020   Lab Results  Component Value Date   HGBA1C 5.7 (H) 05/22/2020   HGBA1C 5.7 (H) 02/21/2020   HGBA1C 5.6 10/19/2019   HGBA1C 6.1 (H) 05/18/2019   Lab Results  Component Value Date   INSULIN 15.5 05/22/2020   INSULIN 14.8 02/21/2020   INSULIN 13.3 10/19/2019   INSULIN 15.1 05/18/2019   Lab Results  Component Value Date   TSH 2.530 05/18/2019   Lab Results  Component Value Date   CHOL 182 05/22/2020   HDL 40 05/22/2020   LDLCALC 116 (H) 05/22/2020   TRIG 147 05/22/2020   Lab Results  Component Value Date   WBC 7.8 05/18/2019    HGB 15.6 05/18/2019   HCT 46.5 05/18/2019   MCV 92 05/18/2019   No results found for: IRON, TIBC, FERRITIN  Attestation Statements:   Reviewed by clinician on day of visit: allergies, medications, problem list, medical history, surgical history, family history, social history, and previous encounter notes.   I, Trixie Dredge, am acting as transcriptionist for Dennard Nip, MD.  I have reviewed the above documentation for accuracy and completeness, and I agree with the above. -  Dennard Nip, MD

## 2020-10-19 LAB — HEMOGLOBIN A1C
Est. average glucose Bld gHb Est-mCnc: 120 mg/dL
Hgb A1c MFr Bld: 5.8 % — ABNORMAL HIGH (ref 4.8–5.6)

## 2020-10-19 LAB — COMPREHENSIVE METABOLIC PANEL
ALT: 38 IU/L — ABNORMAL HIGH (ref 0–32)
AST: 22 IU/L (ref 0–40)
Albumin/Globulin Ratio: 1.8 (ref 1.2–2.2)
Albumin: 4.2 g/dL (ref 3.8–4.9)
Alkaline Phosphatase: 68 IU/L (ref 44–121)
BUN/Creatinine Ratio: 21 (ref 9–23)
BUN: 15 mg/dL (ref 6–24)
Bilirubin Total: 0.8 mg/dL (ref 0.0–1.2)
CO2: 24 mmol/L (ref 20–29)
Calcium: 8.8 mg/dL (ref 8.7–10.2)
Chloride: 106 mmol/L (ref 96–106)
Creatinine, Ser: 0.7 mg/dL (ref 0.57–1.00)
GFR calc Af Amer: 116 mL/min/{1.73_m2} (ref >59)
GFR calc non Af Amer: 101 mL/min/{1.73_m2} (ref >59)
Globulin, Total: 2.3 g/dL (ref 1.5–4.5)
Glucose: 79 mg/dL (ref 65–99)
Potassium: 4.5 mmol/L (ref 3.5–5.2)
Sodium: 140 mmol/L (ref 134–144)
Total Protein: 6.5 g/dL (ref 6.0–8.5)

## 2020-10-19 LAB — LIPID PANEL WITH LDL/HDL RATIO
Cholesterol, Total: 184 mg/dL (ref 100–199)
HDL: 47 mg/dL (ref >39)
LDL Chol Calc (NIH): 108 mg/dL — ABNORMAL HIGH (ref 0–99)
LDL/HDL Ratio: 2.3 ratio (ref 0.0–3.2)
Triglycerides: 163 mg/dL — ABNORMAL HIGH (ref 0–149)
VLDL Cholesterol Cal: 29 mg/dL (ref 5–40)

## 2020-10-19 LAB — INSULIN, RANDOM: INSULIN: 7.1 u[IU]/mL (ref 2.6–24.9)

## 2020-10-19 LAB — VITAMIN D 25 HYDROXY (VIT D DEFICIENCY, FRACTURES): Vit D, 25-Hydroxy: 68.5 ng/mL (ref 30.0–100.0)

## 2020-10-20 MED FILL — SAXENDA 18 MG/3 ML PEN: 18 | 30 days supply | Qty: 15 | Fill #0

## 2020-10-23 DIAGNOSIS — Z23 Encounter for immunization: Secondary | ICD-10-CM | POA: Diagnosis not present

## 2020-10-24 ENCOUNTER — Other Ambulatory Visit (HOSPITAL_COMMUNITY): Payer: Self-pay

## 2020-10-24 MED FILL — ADDERALL XR 15 MG CAP SA: 15 | 30 days supply | Qty: 30 | Fill #0

## 2020-11-06 ENCOUNTER — Encounter (INDEPENDENT_AMBULATORY_CARE_PROVIDER_SITE_OTHER): Payer: Self-pay

## 2020-11-06 ENCOUNTER — Other Ambulatory Visit (INDEPENDENT_AMBULATORY_CARE_PROVIDER_SITE_OTHER): Payer: Self-pay | Admitting: Family Medicine

## 2020-11-06 DIAGNOSIS — R7303 Prediabetes: Secondary | ICD-10-CM

## 2020-11-06 MED FILL — TOPIRAMATE 50 MG TABLET: 50 | 30 days supply | Qty: 30 | Fill #0

## 2020-11-06 NOTE — Telephone Encounter (Signed)
MyChart message sent to pt to find out if they have enough medication to get them through until next appt.   

## 2020-11-06 NOTE — Telephone Encounter (Signed)
Dr Beasley pt 

## 2020-11-13 ENCOUNTER — Other Ambulatory Visit (INDEPENDENT_AMBULATORY_CARE_PROVIDER_SITE_OTHER): Payer: Self-pay | Admitting: Family Medicine

## 2020-11-13 DIAGNOSIS — R7303 Prediabetes: Secondary | ICD-10-CM

## 2020-11-13 MED FILL — VIT D2 1.25 MG (50,000 UNIT: 1.25 MG | 28 days supply | Qty: 4 | Fill #0

## 2020-11-13 NOTE — Telephone Encounter (Signed)
Dr. Leafy Ro and no upcoming appointment

## 2020-11-15 ENCOUNTER — Ambulatory Visit (INDEPENDENT_AMBULATORY_CARE_PROVIDER_SITE_OTHER): Payer: 59 | Admitting: Family Medicine

## 2020-11-16 ENCOUNTER — Other Ambulatory Visit (INDEPENDENT_AMBULATORY_CARE_PROVIDER_SITE_OTHER): Payer: Self-pay | Admitting: Family Medicine

## 2020-11-16 DIAGNOSIS — R7303 Prediabetes: Secondary | ICD-10-CM

## 2020-11-16 NOTE — Telephone Encounter (Signed)
Dr.Beasley 

## 2020-11-20 ENCOUNTER — Other Ambulatory Visit (INDEPENDENT_AMBULATORY_CARE_PROVIDER_SITE_OTHER): Payer: Self-pay | Admitting: Family Medicine

## 2020-11-20 DIAGNOSIS — R7303 Prediabetes: Secondary | ICD-10-CM

## 2020-11-20 NOTE — Telephone Encounter (Signed)
Last OV with Dr. Beasley 

## 2020-12-04 ENCOUNTER — Other Ambulatory Visit (HOSPITAL_COMMUNITY): Payer: Self-pay

## 2020-12-04 MED FILL — DESVENLAFAXINE SUC ER 50 MG: 50 | 30 days supply | Qty: 30 | Fill #0

## 2020-12-04 MED FILL — ADDERALL XR 15 MG CAP SA: 15 | 30 days supply | Qty: 30 | Fill #0

## 2020-12-05 DIAGNOSIS — F3341 Major depressive disorder, recurrent, in partial remission: Secondary | ICD-10-CM | POA: Diagnosis not present

## 2020-12-05 DIAGNOSIS — F900 Attention-deficit hyperactivity disorder, predominantly inattentive type: Secondary | ICD-10-CM | POA: Diagnosis not present

## 2020-12-08 ENCOUNTER — Other Ambulatory Visit (HOSPITAL_COMMUNITY): Payer: Self-pay | Admitting: Obstetrics and Gynecology

## 2020-12-08 DIAGNOSIS — Z01419 Encounter for gynecological examination (general) (routine) without abnormal findings: Secondary | ICD-10-CM | POA: Diagnosis not present

## 2020-12-12 MED FILL — ESTRADIOL 0.1 MG/GM CREA: 0.1 | 90 days supply | Qty: 43 | Fill #0

## 2020-12-25 ENCOUNTER — Other Ambulatory Visit (INDEPENDENT_AMBULATORY_CARE_PROVIDER_SITE_OTHER): Payer: Self-pay | Admitting: Family Medicine

## 2020-12-25 ENCOUNTER — Ambulatory Visit (INDEPENDENT_AMBULATORY_CARE_PROVIDER_SITE_OTHER): Payer: 59 | Admitting: Family Medicine

## 2020-12-25 ENCOUNTER — Other Ambulatory Visit: Payer: Self-pay

## 2020-12-25 ENCOUNTER — Encounter (INDEPENDENT_AMBULATORY_CARE_PROVIDER_SITE_OTHER): Payer: Self-pay | Admitting: Family Medicine

## 2020-12-25 VITALS — BP 120/72 | HR 71 | Temp 97.7°F | Ht 63.0 in

## 2020-12-25 DIAGNOSIS — E559 Vitamin D deficiency, unspecified: Secondary | ICD-10-CM

## 2020-12-25 DIAGNOSIS — G4709 Other insomnia: Secondary | ICD-10-CM | POA: Diagnosis not present

## 2020-12-25 DIAGNOSIS — R7303 Prediabetes: Secondary | ICD-10-CM | POA: Diagnosis not present

## 2020-12-25 DIAGNOSIS — F3289 Other specified depressive episodes: Secondary | ICD-10-CM

## 2020-12-25 DIAGNOSIS — E669 Obesity, unspecified: Secondary | ICD-10-CM

## 2020-12-25 DIAGNOSIS — Z9189 Other specified personal risk factors, not elsewhere classified: Secondary | ICD-10-CM | POA: Diagnosis not present

## 2020-12-25 DIAGNOSIS — Z6832 Body mass index (BMI) 32.0-32.9, adult: Secondary | ICD-10-CM | POA: Diagnosis not present

## 2020-12-25 MED ORDER — DESVENLAFAXINE SUCCINATE ER 50 MG PO TB24: 50.0000 mg | ORAL_TABLET | Freq: Every day | ORAL | 0 refills | Status: DC

## 2020-12-25 MED ORDER — SAXENDA 18 MG/3ML ~~LOC~~ SOPN: 3.0000 mg | PEN_INJECTOR | Freq: Every day | SUBCUTANEOUS | 0 refills | Status: DC

## 2020-12-25 MED ORDER — VITAMIN D (ERGOCALCIFEROL) 1.25 MG (50000 UNIT) PO CAPS: ORAL_CAPSULE | ORAL | 0 refills | Status: DC

## 2020-12-25 MED ORDER — TRAZODONE HCL 50 MG PO TABS: 25.0000 mg | ORAL_TABLET | Freq: Every evening | ORAL | 0 refills | Status: DC | PRN

## 2020-12-25 MED ORDER — TOPIRAMATE 50 MG PO TABS: 50.0000 mg | ORAL_TABLET | Freq: Every day | ORAL | 0 refills | Status: DC

## 2020-12-25 MED ORDER — METFORMIN HCL 500 MG PO TABS: 500.0000 mg | ORAL_TABLET | Freq: Two times a day (BID) | ORAL | 0 refills | Status: DC

## 2020-12-26 MED FILL — TOPIRAMATE 50 MG TABLET: 50 | 30 days supply | Qty: 30 | Fill #0

## 2020-12-26 MED FILL — SAXENDA 18 MG/3 ML PEN: 18 | 30 days supply | Qty: 15 | Fill #0

## 2020-12-26 MED FILL — VIT D2 1.25 MG (50,000 UNIT: 1.25 MG | 28 days supply | Qty: 4 | Fill #0

## 2020-12-26 MED FILL — METFORMIN HCL 500 MG TABS: 500 | 30 days supply | Qty: 60 | Fill #0

## 2020-12-26 MED FILL — traZODone HCL 50 MG TABS: 50 | 30 days supply | Qty: 30 | Fill #0

## 2020-12-27 NOTE — Progress Notes (Signed)
Chief Complaint:   OBESITY Melissa [name] is here to discuss her progress with her obesity treatment plan along with follow-up of her obesity related diagnoses. Melissa [name] is on the Category 2 Plan and states she is following her eating plan approximately 50% of the time. Melissa [name] states she is walking for 15 minutes 5 times per week.  Today's visit was #: 23 Starting weight: 219 lbs Starting date: 05/18/2019 Today's weight: 184 lbs Today's date: 12/25/2020 Total lbs lost to date: 35 Total lbs lost since last in-office visit: 1  Interim History: Melissa [name] continues to do well with weight loss. She has had extra challenges in January, but she is doing well now. She is working on meal planning and prepping for dinner.  Subjective:   1. Vitamin D deficiency Melissa [name] is stable on Vit D, and her last Vit D level was at goal.  2. Other insomnia Melissa [name] is stable on trazodone to help with insomnia. No complaints of daytime somnolence.  3. Pre-diabetes Melissa [name] is working on decreasing simple carbohydrate in diet. She is on metformin and Saxenda, and polyphagia has decreased.  4. Other depression with emotional eating Melissa [name]'s mood is stable on her medications. She is doing well minimizing emotional eating behaviors, and she does not mention any side effects.  5. At risk for impaired metabolic function Melissa [name] is at increased risk for impaired metabolic function if protein decreases.  Assessment/Plan:   1. Vitamin D deficiency Low Vitamin D level contributes to fatigue and are associated with obesity, breast, and colon cancer. We will refill prescription Vitamin D for 1 month. Tyjanae will follow-up for routine testing of Vitamin D, at least 2-3 times per year to avoid over-replacement.  - Vitamin D, Ergocalciferol, (DRISDOL) 1.25 MG (50000 UNIT) CAPS capsule; 1 capsule oral per week  Dispense: 4 capsule; Refill: 0  2. Other insomnia The problem of recurrent insomnia was discussed. Orders and follow up as  documented in patient record. Counseling: Intensive lifestyle modifications are the first line treatment for this issue. We discussed several lifestyle modifications today. We will refill trazodone for 1 month. Pebble will continue to work on diet, exercise and weight loss efforts.   - traZODone (DESYREL) 50 MG tablet; Take 0.5-1 tablets (25-50 mg total) by mouth at bedtime as needed for sleep.  Dispense: 30 tablet; Refill: 0  3. Pre-diabetes We will refill metformin for 1 month. Hyla will continue to work on weight loss, diet, exercise, and decreasing simple carbohydrates to help decrease the risk of diabetes.   - metFORMIN (GLUCOPHAGE) 500 MG tablet; Take 1 tablet (500 mg total) by mouth 2 (two) times daily with a meal.  Dispense: 60 tablet; Refill: 0  4. Other depression with emotional eating Behavior modification techniques were discussed today to help Melissa [name] deal with her emotional/non-hunger eating behaviors. We will refill both topiramate and Pristiq for 1 month. Orders and follow up as documented in patient record.   - desvenlafaxine (PRISTIQ) 50 MG 24 hr tablet; Take 1 tablet (50 mg total) by mouth daily.  Dispense: 30 tablet; Refill: 0 - topiramate (TOPAMAX) 50 MG tablet; Take 1 tablet (50 mg total) by mouth daily.  Dispense: 30 tablet; Refill: 0  5. At risk for impaired metabolic function Melissa [name] was given approximately 15 minutes of impaired  metabolic function prevention counseling today. We discussed intensive lifestyle modifications today with an emphasis on specific nutrition and exercise instructions and strategies.   Repetitive spaced learning was employed today to elicit superior memory formation  and behavioral change.  6. Class 1 obesity with serious comorbidity and body mass index (BMI) of 32.0 to 32.9 in adult, unspecified obesity type Melissa [name] is currently in the action stage of change. As such, her goal is to continue with weight loss efforts. She has agreed to the Category 2  Plan.   We discussed various medication options to help Melissa [name] with her weight loss efforts and we both agreed to continue Saxenda, and we will refill for 1 month.  - Liraglutide -Weight Management (SAXENDA) 18 MG/3ML SOPN; Inject 3 mg into the skin daily.  Dispense: 15 mL; Refill: 0  We will recheck fasting labs at her next visit.  Exercise goals: As is.  Behavioral modification strategies: increasing lean protein intake and keeping healthy foods in the home.  Melissa [name] has agreed to follow-up with our clinic in 4 weeks. She was informed of the importance of frequent follow-up visits to maximize her success with intensive lifestyle modifications for her multiple health conditions.   Objective:   Blood pressure 120/72, pulse 71, temperature 97.7 F (36.5 C), height 5\' 3"  (1.6 m), SpO2 98 %. Body mass index is 32.77 kg/m.  General: Cooperative, alert, well developed, in no acute distress. HEENT: Conjunctivae and lids unremarkable. Cardiovascular: Regular rhythm.  Lungs: Normal work of breathing. Neurologic: No focal deficits.   Lab Results  Component Value Date   CREATININE 0.70 10/18/2020   BUN 15 10/18/2020   NA 140 10/18/2020   K 4.5 10/18/2020   CL 106 10/18/2020   CO2 24 10/18/2020   Lab Results  Component Value Date   ALT 38 (H) 10/18/2020   AST 22 10/18/2020   ALKPHOS 68 10/18/2020   BILITOT 0.8 10/18/2020   Lab Results  Component Value Date   HGBA1C 5.8 (H) 10/18/2020   HGBA1C 5.7 (H) 05/22/2020   HGBA1C 5.7 (H) 02/21/2020   HGBA1C 5.6 10/19/2019   HGBA1C 6.1 (H) 05/18/2019   Lab Results  Component Value Date   INSULIN 7.1 10/18/2020   INSULIN 15.5 05/22/2020   INSULIN 14.8 02/21/2020   INSULIN 13.3 10/19/2019   INSULIN 15.1 05/18/2019   Lab Results  Component Value Date   TSH 2.530 05/18/2019   Lab Results  Component Value Date   CHOL 184 10/18/2020   HDL 47 10/18/2020   LDLCALC 108 (H) 10/18/2020   TRIG 163 (H) 10/18/2020   Lab Results   Component Value Date   WBC 7.8 05/18/2019   HGB 15.6 05/18/2019   HCT 46.5 05/18/2019   MCV 92 05/18/2019   No results found for: IRON, TIBC, FERRITIN  Attestation Statements:   Reviewed by clinician on day of visit: allergies, medications, problem list, medical history, surgical history, family history, social history, and previous encounter notes.   I, Trixie Dredge, am acting as transcriptionist for Dennard Nip, MD.  I have reviewed the above documentation for accuracy and completeness, and I agree with the above. -  Dennard Nip, MD

## 2020-12-28 MED FILL — DESVENLAFAXINE SUC ER 50 MG: 50 | 30 days supply | Qty: 30 | Fill #0

## 2021-01-01 ENCOUNTER — Other Ambulatory Visit (HOSPITAL_COMMUNITY): Payer: Self-pay

## 2021-01-22 ENCOUNTER — Other Ambulatory Visit (INDEPENDENT_AMBULATORY_CARE_PROVIDER_SITE_OTHER): Payer: Self-pay | Admitting: Family Medicine

## 2021-01-22 ENCOUNTER — Other Ambulatory Visit (INDEPENDENT_AMBULATORY_CARE_PROVIDER_SITE_OTHER): Payer: Self-pay | Admitting: Bariatrics

## 2021-01-22 DIAGNOSIS — F3289 Other specified depressive episodes: Secondary | ICD-10-CM

## 2021-01-22 DIAGNOSIS — R7303 Prediabetes: Secondary | ICD-10-CM

## 2021-01-22 DIAGNOSIS — E559 Vitamin D deficiency, unspecified: Secondary | ICD-10-CM

## 2021-01-22 MED FILL — TOPIRAMATE 50 MG TABLET: 50 | 30 days supply | Qty: 30 | Fill #0

## 2021-01-22 MED FILL — VIT D2 1.25 MG (50,000 UNIT: 1.25 MG | 28 days supply | Qty: 4 | Fill #0

## 2021-01-22 MED FILL — METFORMIN HCL 500 MG TABS: 500 | 30 days supply | Qty: 60 | Fill #0

## 2021-01-22 MED FILL — DESVENLAFAXINE SUC ER 50 MG: 50 | 30 days supply | Qty: 30 | Fill #0

## 2021-01-22 NOTE — Telephone Encounter (Signed)
Would you like to refill? 

## 2021-01-22 NOTE — Telephone Encounter (Signed)
Please advise if refill is appropriate in Dr. Migdalia Dk absence.

## 2021-01-23 NOTE — Telephone Encounter (Signed)
Ok x 1

## 2021-02-03 ENCOUNTER — Other Ambulatory Visit (HOSPITAL_COMMUNITY): Payer: Self-pay

## 2021-02-05 ENCOUNTER — Ambulatory Visit (INDEPENDENT_AMBULATORY_CARE_PROVIDER_SITE_OTHER): Payer: 59 | Admitting: Family Medicine

## 2021-02-05 ENCOUNTER — Other Ambulatory Visit: Payer: Self-pay

## 2021-02-05 ENCOUNTER — Encounter (INDEPENDENT_AMBULATORY_CARE_PROVIDER_SITE_OTHER): Payer: Self-pay | Admitting: Family Medicine

## 2021-02-05 ENCOUNTER — Other Ambulatory Visit (HOSPITAL_COMMUNITY): Payer: Self-pay

## 2021-02-05 VITALS — BP 110/72 | HR 80 | Temp 97.8°F | Ht 63.0 in | Wt 185.0 lb

## 2021-02-05 DIAGNOSIS — Z9189 Other specified personal risk factors, not elsewhere classified: Secondary | ICD-10-CM

## 2021-02-05 DIAGNOSIS — Z6838 Body mass index (BMI) 38.0-38.9, adult: Secondary | ICD-10-CM | POA: Diagnosis not present

## 2021-02-05 DIAGNOSIS — R7303 Prediabetes: Secondary | ICD-10-CM | POA: Diagnosis not present

## 2021-02-05 DIAGNOSIS — F3289 Other specified depressive episodes: Secondary | ICD-10-CM | POA: Diagnosis not present

## 2021-02-05 MED ORDER — LIRAGLUTIDE -WEIGHT MANAGEMENT 18 MG/3ML ~~LOC~~ SOPN
3.0000 mg | PEN_INJECTOR | Freq: Every day | SUBCUTANEOUS | 1 refills | Status: DC
  Filled 2021-02-05: qty 15, 30d supply, fill #0

## 2021-02-05 MED ORDER — DESVENLAFAXINE SUCCINATE ER 50 MG PO TB24
50.0000 mg | ORAL_TABLET | Freq: Every day | ORAL | 1 refills | Status: DC
  Filled 2021-02-05: qty 30, 30d supply, fill #0

## 2021-02-05 MED FILL — Amphetamine-Dextroamphetamine Cap ER 24HR 15 MG: ORAL | 30 days supply | Qty: 30 | Fill #0 | Status: AC

## 2021-02-09 ENCOUNTER — Other Ambulatory Visit (HOSPITAL_COMMUNITY): Payer: Self-pay

## 2021-02-17 ENCOUNTER — Other Ambulatory Visit (INDEPENDENT_AMBULATORY_CARE_PROVIDER_SITE_OTHER): Payer: Self-pay | Admitting: Bariatrics

## 2021-02-17 DIAGNOSIS — R7303 Prediabetes: Secondary | ICD-10-CM

## 2021-02-17 DIAGNOSIS — F3289 Other specified depressive episodes: Secondary | ICD-10-CM

## 2021-02-17 DIAGNOSIS — E559 Vitamin D deficiency, unspecified: Secondary | ICD-10-CM

## 2021-02-19 ENCOUNTER — Other Ambulatory Visit (HOSPITAL_COMMUNITY): Payer: Self-pay

## 2021-02-19 MED ORDER — TOPIRAMATE 50 MG PO TABS
50.0000 mg | ORAL_TABLET | Freq: Every day | ORAL | 0 refills | Status: DC
  Filled 2021-02-19: qty 30, 30d supply, fill #0

## 2021-02-19 MED ORDER — METFORMIN HCL 500 MG PO TABS
500.0000 mg | ORAL_TABLET | Freq: Two times a day (BID) | ORAL | 0 refills | Status: DC
  Filled 2021-02-19: qty 60, 30d supply, fill #0

## 2021-02-19 MED ORDER — VITAMIN D (ERGOCALCIFEROL) 1.25 MG (50000 UNIT) PO CAPS
1.0000 | ORAL_CAPSULE | ORAL | 0 refills | Status: DC
  Filled 2021-02-19: qty 4, 28d supply, fill #0

## 2021-02-19 NOTE — Telephone Encounter (Signed)
Ok x 1

## 2021-02-20 NOTE — Progress Notes (Signed)
Chief Complaint:   OBESITY Melissa [name] is here to discuss her progress with her obesity treatment plan along with follow-up of her obesity related diagnoses. Melissa [name] is on the Category 2 Plan and states she is following her eating plan approximately 50% of the time. Melissa [name] states she is walking for 15-20 minutes 3 times per week.  Today's visit was #: 24 Starting weight: 219 lbs Starting date: 05/18/2019 Today's weight: 185 lbs Today's date: 02/05/2021 Total lbs lost to date: 34 Total lbs lost since last in-office visit: 0  Interim History: Melissa [name] has struggled more with extra temptations and celebration eating. She is stable on Saxenda and she requests a refill today. Her husband has changed shifts and this should help with meal planning and prepping.  Subjective:   1. Pre-diabetes Melissa [name] most recent A1c was 5.8. She is working on diet and exercise and she is stable on metformin and Saxenda.  2. Other depression with emotional eating Melissa [name] is stable on her medications. She still struggles with some frequent wakening but she can usually go back to sleep.   3. At risk for diabetes mellitus Melissa [name] is at higher than average risk for developing diabetes due to obesity.   Assessment/Plan:   1. Pre-diabetes Melissa [name] will continue metformin as is, and will continue to work on weight loss, exercise, and decreasing simple carbohydrates to help decrease the risk of diabetes.   2. Other depression with emotional eating Behavior modification techniques were discussed today to help Melissa [name] deal with her emotional/non-hunger eating behaviors. We will refill Pristiq for 2 months. Orders and follow up as documented in patient record.   - desvenlafaxine (PRISTIQ) 50 MG 24 hr tablet; Take 1 tablet (50 mg total) by mouth daily.  Dispense: 30 tablet; Refill: 1  3. At risk for diabetes mellitus Melissa [name] was given approximately 15 minutes of diabetes education and counseling today. We discussed intensive lifestyle  modifications today with an emphasis on weight loss as well as increasing exercise and decreasing simple carbohydrates in her diet. We also reviewed medication options with an emphasis on risk versus benefit of those discussed.   Repetitive spaced learning was employed today to elicit superior memory formation and behavioral change.  4. Obesity with current BMI of 32.8 Melissa [name] is currently in the action stage of change. As such, her goal is to continue with weight loss efforts. She has agreed to the Category 2 Plan.   We discussed various medication options to help Melissa [name] with her weight loss efforts and we both agreed to continue Saxenda, and we will refill for 2 months.  - Liraglutide -Weight Management 18 MG/3ML SOPN; INJECT 3 MG INTO THE SKIN DAILY.  Dispense: 15 mL; Refill: 1  Exercise goals: As is.  Behavioral modification strategies: increasing lean protein intake and meal planning and cooking strategies.  Melissa [name] has agreed to follow-up with our clinic in 6 weeks. She was informed of the importance of frequent follow-up visits to maximize her success with intensive lifestyle modifications for her multiple health conditions.   Objective:   Blood pressure 110/72, pulse 80, temperature 97.8 F (36.6 C), height 5\' 3"  (1.6 m), weight 185 lb (83.9 kg), SpO2 100 %. Body mass index is 32.77 kg/m.  General: Cooperative, alert, well developed, in no acute distress. HEENT: Conjunctivae and lids unremarkable. Cardiovascular: Regular rhythm.  Lungs: Normal work of breathing. Neurologic: No focal deficits.   Lab Results  Component Value Date   CREATININE 0.70 10/18/2020   BUN 15 10/18/2020  NA 140 10/18/2020   K 4.5 10/18/2020   CL 106 10/18/2020   CO2 24 10/18/2020   Lab Results  Component Value Date   ALT 38 (H) 10/18/2020   AST 22 10/18/2020   ALKPHOS 68 10/18/2020   BILITOT 0.8 10/18/2020   Lab Results  Component Value Date   HGBA1C 5.8 (H) 10/18/2020   HGBA1C 5.7 (H)  05/22/2020   HGBA1C 5.7 (H) 02/21/2020   HGBA1C 5.6 10/19/2019   HGBA1C 6.1 (H) 05/18/2019   Lab Results  Component Value Date   INSULIN 7.1 10/18/2020   INSULIN 15.5 05/22/2020   INSULIN 14.8 02/21/2020   INSULIN 13.3 10/19/2019   INSULIN 15.1 05/18/2019   Lab Results  Component Value Date   TSH 2.530 05/18/2019   Lab Results  Component Value Date   CHOL 184 10/18/2020   HDL 47 10/18/2020   LDLCALC 108 (H) 10/18/2020   TRIG 163 (H) 10/18/2020   Lab Results  Component Value Date   WBC 7.8 05/18/2019   HGB 15.6 05/18/2019   HCT 46.5 05/18/2019   MCV 92 05/18/2019   No results found for: IRON, TIBC, FERRITIN  Attestation Statements:   Reviewed by clinician on day of visit: allergies, medications, problem list, medical history, surgical history, family history, social history, and previous encounter notes.   I, Trixie Dredge, am acting as transcriptionist for Dennard Nip, MD.  I have reviewed the above documentation for accuracy and completeness, and I agree with the above. -  Dennard Nip, MD

## 2021-02-28 ENCOUNTER — Other Ambulatory Visit (HOSPITAL_COMMUNITY): Payer: Self-pay

## 2021-02-28 DIAGNOSIS — F900 Attention-deficit hyperactivity disorder, predominantly inattentive type: Secondary | ICD-10-CM | POA: Diagnosis not present

## 2021-02-28 DIAGNOSIS — F3341 Major depressive disorder, recurrent, in partial remission: Secondary | ICD-10-CM | POA: Diagnosis not present

## 2021-02-28 MED ORDER — DESVENLAFAXINE SUCCINATE ER 50 MG PO TB24
50.0000 mg | ORAL_TABLET | Freq: Every day | ORAL | 0 refills | Status: DC
  Filled 2021-02-28: qty 90, 90d supply, fill #0

## 2021-03-08 ENCOUNTER — Other Ambulatory Visit (HOSPITAL_COMMUNITY): Payer: Self-pay

## 2021-03-14 ENCOUNTER — Other Ambulatory Visit (HOSPITAL_COMMUNITY): Payer: Self-pay

## 2021-03-14 MED ORDER — ADDERALL XR 15 MG PO CP24
15.0000 mg | ORAL_CAPSULE | Freq: Every morning | ORAL | 0 refills | Status: DC
  Filled 2021-03-14: qty 30, 30d supply, fill #0

## 2021-03-15 ENCOUNTER — Other Ambulatory Visit (HOSPITAL_COMMUNITY): Payer: Self-pay

## 2021-03-15 ENCOUNTER — Other Ambulatory Visit (INDEPENDENT_AMBULATORY_CARE_PROVIDER_SITE_OTHER): Payer: Self-pay | Admitting: Family Medicine

## 2021-03-15 DIAGNOSIS — F3289 Other specified depressive episodes: Secondary | ICD-10-CM

## 2021-03-15 DIAGNOSIS — E559 Vitamin D deficiency, unspecified: Secondary | ICD-10-CM

## 2021-03-15 NOTE — Telephone Encounter (Signed)
Pt last seen by Dr. Beasley.  

## 2021-03-19 ENCOUNTER — Ambulatory Visit (INDEPENDENT_AMBULATORY_CARE_PROVIDER_SITE_OTHER): Payer: 59 | Admitting: Family Medicine

## 2021-03-27 ENCOUNTER — Other Ambulatory Visit: Payer: Self-pay

## 2021-03-27 ENCOUNTER — Telehealth (INDEPENDENT_AMBULATORY_CARE_PROVIDER_SITE_OTHER): Payer: 59 | Admitting: Family Medicine

## 2021-03-27 ENCOUNTER — Other Ambulatory Visit (HOSPITAL_COMMUNITY): Payer: Self-pay

## 2021-03-27 DIAGNOSIS — F3289 Other specified depressive episodes: Secondary | ICD-10-CM | POA: Diagnosis not present

## 2021-03-27 DIAGNOSIS — Z6838 Body mass index (BMI) 38.0-38.9, adult: Secondary | ICD-10-CM | POA: Diagnosis not present

## 2021-03-27 MED ORDER — DESVENLAFAXINE SUCCINATE ER 50 MG PO TB24
50.0000 mg | ORAL_TABLET | Freq: Every day | ORAL | 0 refills | Status: DC
  Filled 2021-03-27: qty 30, 30d supply, fill #0

## 2021-03-27 MED ORDER — TOPIRAMATE 50 MG PO TABS
50.0000 mg | ORAL_TABLET | Freq: Every day | ORAL | 0 refills | Status: DC
  Filled 2021-03-27: qty 30, 30d supply, fill #0

## 2021-03-28 NOTE — Progress Notes (Signed)
TeleHealth Visit:  Due to the COVID-19 pandemic, this visit was completed with telemedicine (audio/video) technology to reduce patient and provider exposure as well as to preserve personal protective equipment.   Melissa [name] has verbally consented to this TeleHealth visit. The patient is located at home, the provider is located at the [facility] office. The participants in this visit include the listed provider and patient. The visit was conducted today via MyChart video.   Chief Complaint: OBESITY Melissa [name] is here to discuss her progress with her obesity treatment plan along with follow-up of her obesity related diagnoses. Melissa [name] is on the Category 2 Plan and states she is following her eating plan approximately 0% of the time. Melissa [name] states she was walking for 3 days 4,000-10,000 steps.   Today's visit was #: 25 Starting weight: 219 lbs Starting date: 05/18/2019  Interim History: Melissa [name]'s visit was changed to video visit due to recent positive COVID test. She had been on vacation and increased eating out so she feels she may have gained 2-3 lbs. She is trying to increase her sleep and get back on a routine with her eating.  Subjective:   1. Other depression with emotional eating Patte is stable on Topamax, requests a refill today. She is on Pristiq and she ran out today. She normally gets this from her primary care physician, but requests a refill so she will not miss doses until she can contact her primary care physician.   Assessment/Plan:   1. Other depression with emotional eating Behavior modification techniques were discussed today to help Melissa [name] deal with her emotional/non-hunger eating behaviors. We will refill both Topamax and Pristiq for 1 month. Orders and follow up as documented in patient record.   - topiramate (TOPAMAX) 50 MG tablet; Take 1 tablet (50 mg total) by mouth daily.  Dispense: 30 tablet; Refill: 0 - desvenlafaxine (PRISTIQ) 50 MG 24 hr tablet; Take 1  tablet (50 mg total) by mouth daily.  Dispense: 30 tablet; Refill: 0  2. Obesity with current BMI 32.77 Melissa [name] is currently in the action stage of change. As such, her goal is to continue with weight loss efforts. She has agreed to the Category 2 Plan.   Exercise goals: As is.  Behavioral modification strategies: increasing water intake and no skipping meals.  Melissa [name] has agreed to follow-up with our clinic in 4 weeks. She was informed of the importance of frequent follow-up visits to maximize her success with intensive lifestyle modifications for her multiple health conditions.  Objective:   VITALS: Per patient if applicable, see vitals. GENERAL: Alert and in no acute distress. CARDIOPULMONARY: No increased WOB. Speaking in clear sentences.  PSYCH: Pleasant and cooperative. Speech normal rate and rhythm. Affect is appropriate. Insight and judgement are appropriate. Attention is focused, linear, and appropriate.  NEURO: Oriented as arrived to appointment on time with no prompting.   Lab Results  Component Value Date   CREATININE 0.70 10/18/2020   BUN 15 10/18/2020   NA 140 10/18/2020   K 4.5 10/18/2020   CL 106 10/18/2020   CO2 24 10/18/2020   Lab Results  Component Value Date   ALT 38 (H) 10/18/2020   AST 22 10/18/2020   ALKPHOS 68 10/18/2020   BILITOT 0.8 10/18/2020   Lab Results  Component Value Date   HGBA1C 5.8 (H) 10/18/2020   HGBA1C 5.7 (H) 05/22/2020   HGBA1C 5.7 (H) 02/21/2020   HGBA1C 5.6 10/19/2019   HGBA1C 6.1 (H) 05/18/2019   Lab  Results  Component Value Date   INSULIN 7.1 10/18/2020   INSULIN 15.5 05/22/2020   INSULIN 14.8 02/21/2020   INSULIN 13.3 10/19/2019   INSULIN 15.1 05/18/2019   Lab Results  Component Value Date   TSH 2.530 05/18/2019   Lab Results  Component Value Date   CHOL 184 10/18/2020   HDL 47 10/18/2020   LDLCALC 108 (H) 10/18/2020   TRIG 163 (H) 10/18/2020   Lab Results  Component Value Date   WBC 7.8 05/18/2019   HGB 15.6  05/18/2019   HCT 46.5 05/18/2019   MCV 92 05/18/2019   No results found for: IRON, TIBC, FERRITIN  Attestation Statements:   Reviewed by clinician on day of visit: allergies, medications, problem list, medical history, surgical history, family history, social history, and previous encounter notes.   I, Trixie Dredge, am acting as transcriptionist for Dennard Nip, MD.  I have reviewed the above documentation for accuracy and completeness, and I agree with the above. - Dennard Nip, MD

## 2021-04-02 ENCOUNTER — Other Ambulatory Visit (INDEPENDENT_AMBULATORY_CARE_PROVIDER_SITE_OTHER): Payer: Self-pay | Admitting: Family Medicine

## 2021-04-02 DIAGNOSIS — R7303 Prediabetes: Secondary | ICD-10-CM

## 2021-04-03 ENCOUNTER — Encounter (INDEPENDENT_AMBULATORY_CARE_PROVIDER_SITE_OTHER): Payer: Self-pay | Admitting: Family Medicine

## 2021-04-03 ENCOUNTER — Other Ambulatory Visit (HOSPITAL_COMMUNITY): Payer: Self-pay

## 2021-04-03 MED ORDER — METFORMIN HCL 500 MG PO TABS
500.0000 mg | ORAL_TABLET | Freq: Two times a day (BID) | ORAL | 0 refills | Status: DC
  Filled 2021-04-03: qty 60, 30d supply, fill #0

## 2021-04-03 MED ORDER — CARESTART COVID-19 HOME TEST VI KIT
PACK | 0 refills | Status: DC
  Filled 2021-04-03: qty 4, 4d supply, fill #0

## 2021-04-03 NOTE — Telephone Encounter (Signed)
Patient is requesting a refill of the following medications: Requested Prescriptions   Pending Prescriptions Disp Refills   metFORMIN (GLUCOPHAGE) 500 MG tablet 60 tablet 0    Sig: Take 1 tablet (500 mg total) by mouth 2 (two) times daily with a meal.    Last office visit: 03/27/21 Date of last refill: 02/19/21 Last refill amount: 60 Follow up time period per chart:4 week Next scheduled appt : 04/26/21

## 2021-04-03 NOTE — Telephone Encounter (Signed)
Ok x 1

## 2021-04-19 ENCOUNTER — Ambulatory Visit (INDEPENDENT_AMBULATORY_CARE_PROVIDER_SITE_OTHER): Payer: 59 | Admitting: Bariatrics

## 2021-04-22 IMAGING — MG DIGITAL SCREENING BILAT W/ TOMO W/ CAD
8 series · 8 of 24 positions shown · non-contrast
Comparison: Previous exam(s).

CLINICAL DATA: Screening.

EXAM:
DIGITAL SCREENING BILATERAL MAMMOGRAM WITH TOMO AND CAD

[L CC synth-2D]
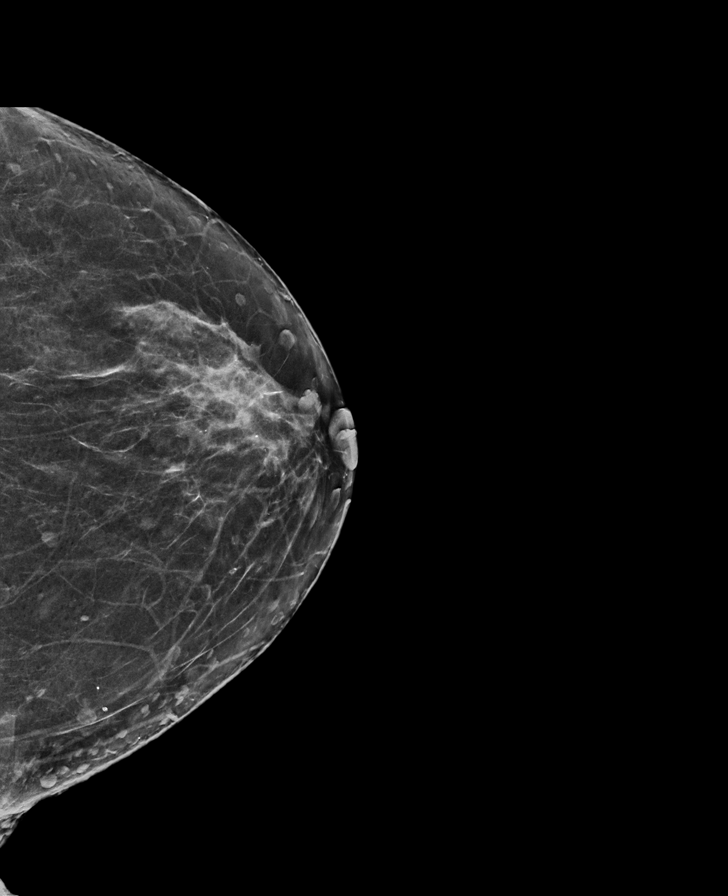

[R MLO synth-2D]
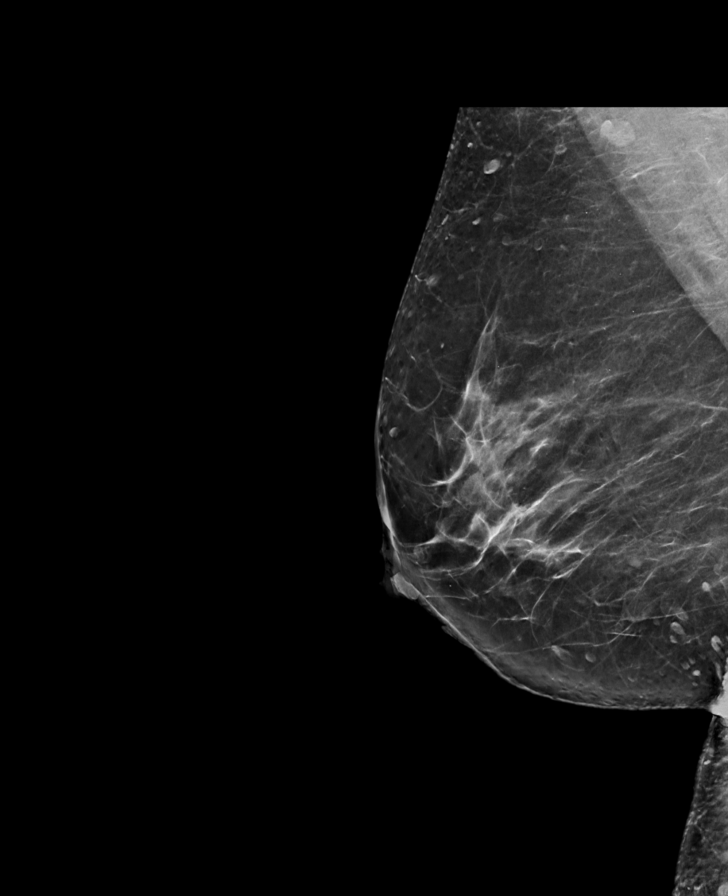

[R CC synth-2D]
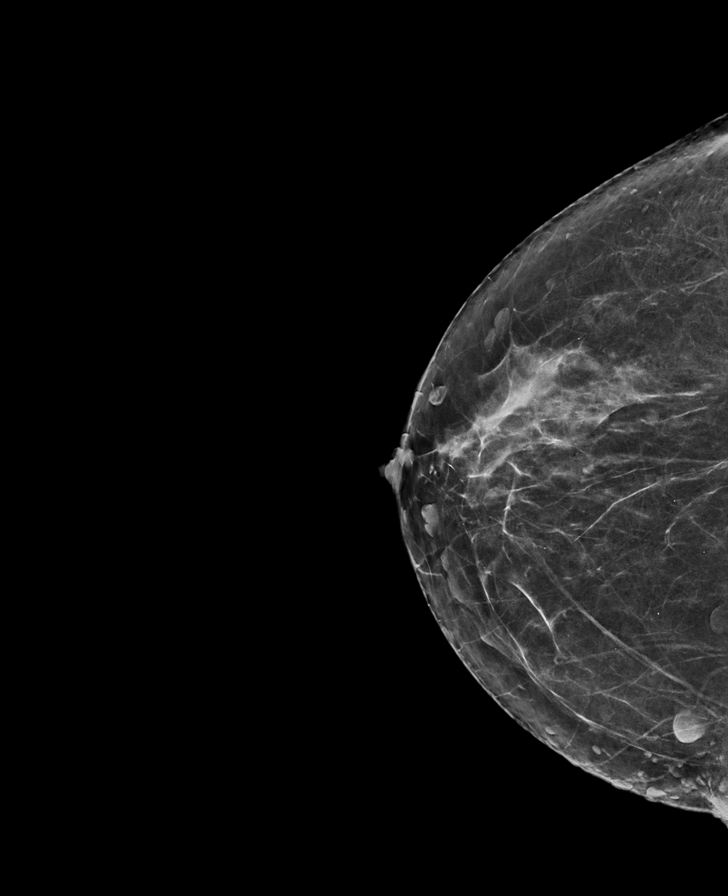

[L MLO synth-2D]
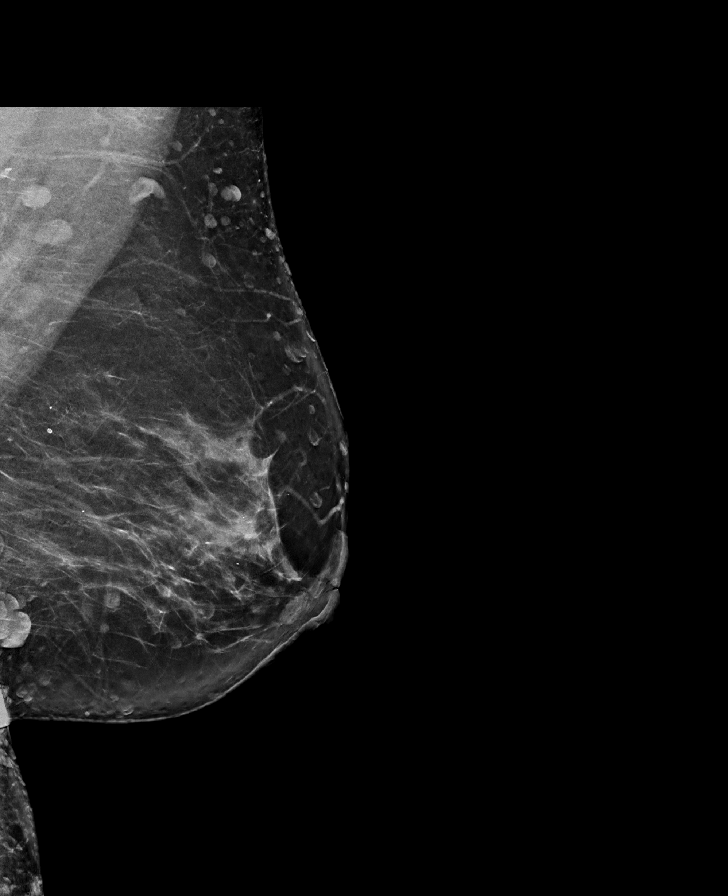

[L MLO tomo · tomo slice 43/85.0]
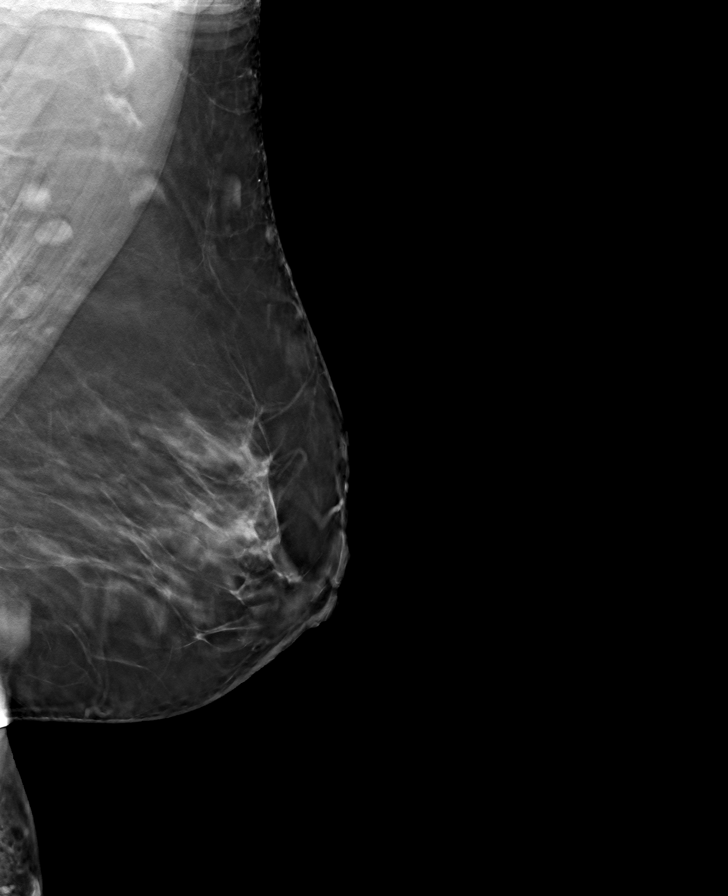

[R MLO tomo · tomo slice 39/78.0]
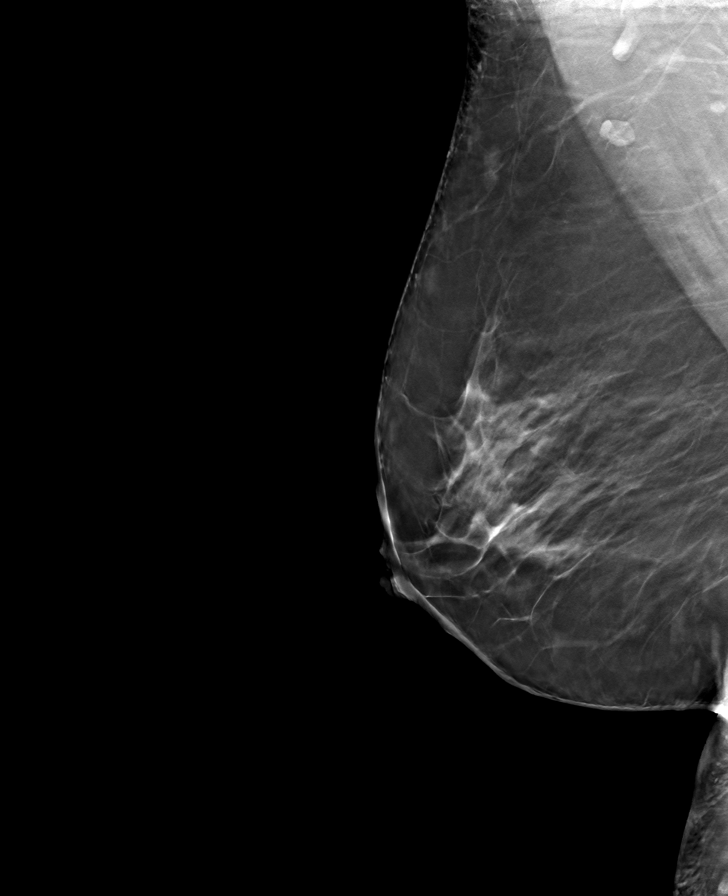

[R CC tomo · tomo slice 34/67.0]
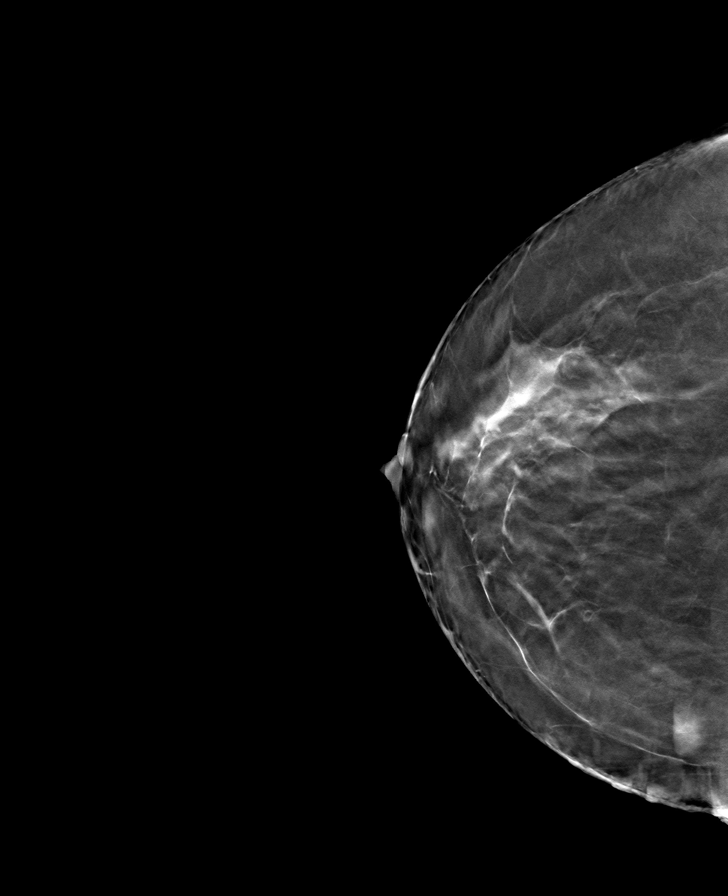

[L CC tomo · tomo slice 34/67.0]
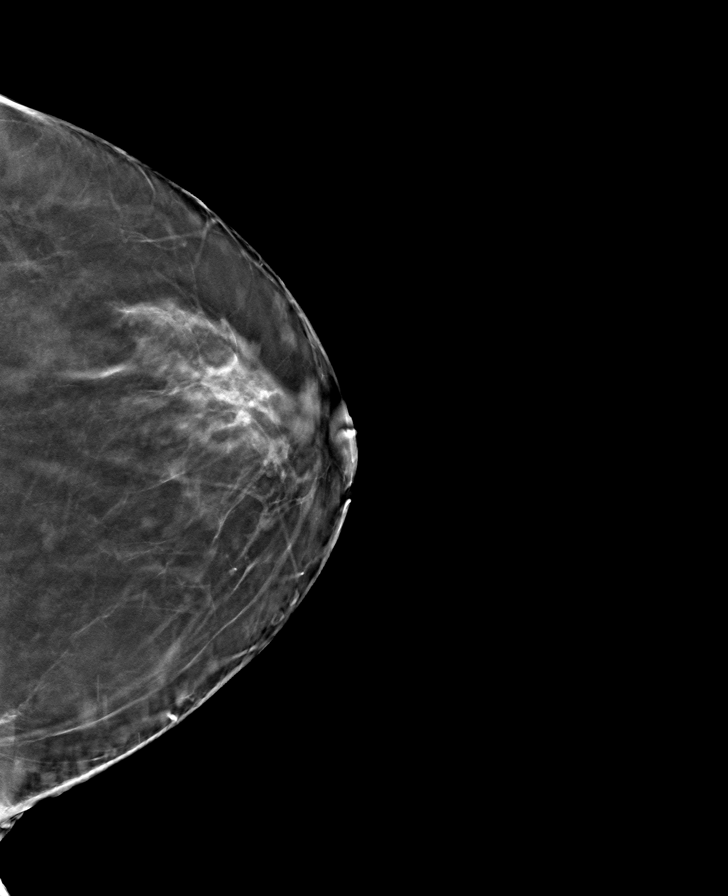

[8 of 24 positions shown; findings below may reference images not displayed]

ACR Breast Density Category c: The breast tissue is heterogeneously
dense, which may obscure small masses.
FINDINGS: There are no findings suspicious for malignancy. Images were
processed with CAD.
IMPRESSION: No mammographic evidence of malignancy. A result letter of this
screening mammogram will be mailed directly to the patient.

RECOMMENDATION:
Screening mammogram in one year. (Code:[id number])

BI-RADS CATEGORY  1: Negative.

## 2021-04-23 ENCOUNTER — Other Ambulatory Visit (HOSPITAL_COMMUNITY): Payer: Self-pay

## 2021-04-23 ENCOUNTER — Telehealth (INDEPENDENT_AMBULATORY_CARE_PROVIDER_SITE_OTHER): Payer: 59 | Admitting: Family Medicine

## 2021-04-23 ENCOUNTER — Encounter (INDEPENDENT_AMBULATORY_CARE_PROVIDER_SITE_OTHER): Payer: Self-pay | Admitting: Family Medicine

## 2021-04-23 DIAGNOSIS — Z6838 Body mass index (BMI) 38.0-38.9, adult: Secondary | ICD-10-CM

## 2021-04-23 DIAGNOSIS — F3289 Other specified depressive episodes: Secondary | ICD-10-CM | POA: Diagnosis not present

## 2021-04-23 DIAGNOSIS — E559 Vitamin D deficiency, unspecified: Secondary | ICD-10-CM

## 2021-04-23 DIAGNOSIS — R7303 Prediabetes: Secondary | ICD-10-CM

## 2021-04-23 MED ORDER — TOPIRAMATE 50 MG PO TABS
50.0000 mg | ORAL_TABLET | Freq: Every day | ORAL | 0 refills | Status: DC
  Filled 2021-04-23: qty 30, 30d supply, fill #0

## 2021-04-23 MED ORDER — INSULIN PEN NEEDLE 32G X 4 MM MISC
0 refills | Status: DC
  Filled 2021-04-23: qty 100, 90d supply, fill #0

## 2021-04-23 MED ORDER — VITAMIN D (ERGOCALCIFEROL) 1.25 MG (50000 UNIT) PO CAPS
1.0000 | ORAL_CAPSULE | ORAL | 0 refills | Status: DC
  Filled 2021-04-23: qty 4, 28d supply, fill #0

## 2021-04-23 MED ORDER — METFORMIN HCL 500 MG PO TABS
500.0000 mg | ORAL_TABLET | Freq: Two times a day (BID) | ORAL | 0 refills | Status: DC
  Filled 2021-04-23: qty 60, 30d supply, fill #0

## 2021-04-23 MED ORDER — LIRAGLUTIDE -WEIGHT MANAGEMENT 18 MG/3ML ~~LOC~~ SOPN
3.0000 mg | PEN_INJECTOR | Freq: Every day | SUBCUTANEOUS | 0 refills | Status: DC
  Filled 2021-04-23 – 2021-05-01 (×3): qty 15, 30d supply, fill #0

## 2021-04-24 NOTE — Progress Notes (Signed)
TeleHealth Visit:  Due to the COVID-19 pandemic, this visit was completed with telemedicine (audio/video) technology to reduce patient and provider exposure as well as to preserve personal protective equipment.   Melissa [name] has verbally consented to this TeleHealth visit. The patient is located at home, the provider is located at the [facility] office. The participants in this visit include the listed provider and patient. The visit was conducted today via MyChart video.   Chief Complaint: OBESITY Melissa [name] is here to discuss her progress with her obesity treatment plan along with follow-up of her obesity related diagnoses. Melissa [name] is on the Category 2 Plan and states she is following her eating plan approximately 50% of the time. Melissa [name] states she is walking 6,000-10,000 steps 4-6 times per week.   Today's visit was #: 26 Starting weight: 219 lbs Starting date: 05/18/2019  Interim History: Melissa [name]'s husband is sick with COVID so her visit was changed to virtual. She was on vacation and she did some celebration eating, but she has gotten back on track with her eating plan. She is back on Saxenda.  Subjective:   1. Pre-diabetes Melissa [name] is doing well on metformin and Saxenda. Her last A1c was at 5.8. She continues to work on diet, exercise, and weight loss.  2. Vitamin D deficiency Melissa [name]'s last Vit D level was at goal. She denies signs of over-replacement.  3. Other depression with emotional eating Melissa [name]'s mood is stable on her medications. She is doing better minimizing her emotional eating behaviors even while on vacation.  Assessment/Plan:   1. Pre-diabetes Melissa [name] will continue to work on weight loss, exercise, and decreasing simple carbohydrates to help decrease the risk of diabetes. We will refill metformin for 1 month.  - metFORMIN (GLUCOPHAGE) 500 MG tablet; Take 1 tablet (500 mg total) by mouth 2 (two) times daily with a meal.  Dispense: 60 tablet; Refill: 0  2. Vitamin D  deficiency Low Vitamin D level contributes to fatigue and are associated with obesity, breast, and colon cancer. We will refill prescription Vitamin D for 1 month. Melissa [name] will follow-up for routine testing of Vitamin D, at least 2-3 times per year to avoid over-replacement.  - Vitamin D, Ergocalciferol, (DRISDOL) 1.25 MG (50000 UNIT) CAPS capsule; Take 1 capsule (50,000 Units total) by mouth once a week.  Dispense: 4 capsule; Refill: 0  3. Other depression with emotional eating Behavior modification techniques were discussed today to help Melissa [name] deal with her emotional/non-hunger eating behaviors. We will refill Topamax for 1 month. Orders and follow up as documented in patient record.   - topiramate (TOPAMAX) 50 MG tablet; Take 1 tablet (50 mg total) by mouth daily.  Dispense: 30 tablet; Refill: 0  4. Obesity with current BMI of 32.8 Melissa [name] is currently in the action stage of change. As such, her goal is to continue with weight loss efforts. She has agreed to the Category 2 Plan.   We discussed various medication options to help Melissa [name] with her weight loss efforts and we both agreed to continue Saxenda, and we will refill for 1 month, and we will refill pen needles #100 with no refills.  - Insulin Pen Needle 32G X 4 MM MISC; Give injection once daily  Dispense: 100 each; Refill: 0 - Liraglutide -Weight Management 18 MG/3ML SOPN; INJECT 3 MG INTO THE SKIN DAILY.  Dispense: 15 mL; Refill: 0  Exercise goals: As is.  Behavioral modification strategies: meal planning and cooking strategies.  Melissa [name] has agreed to follow-up  with our clinic in 4 weeks. She was informed of the importance of frequent follow-up visits to maximize her success with intensive lifestyle modifications for her multiple health conditions.  Objective:   VITALS: Per patient if applicable, see vitals. GENERAL: Alert and in no acute distress. CARDIOPULMONARY: No increased WOB. Speaking in clear sentences.  PSYCH: Pleasant and  cooperative. Speech normal rate and rhythm. Affect is appropriate. Insight and judgement are appropriate. Attention is focused, linear, and appropriate.  NEURO: Oriented as arrived to appointment on time with no prompting.   Lab Results  Component Value Date   CREATININE 0.70 10/18/2020   BUN 15 10/18/2020   NA 140 10/18/2020   K 4.5 10/18/2020   CL 106 10/18/2020   CO2 24 10/18/2020   Lab Results  Component Value Date   ALT 38 (H) 10/18/2020   AST 22 10/18/2020   ALKPHOS 68 10/18/2020   BILITOT 0.8 10/18/2020   Lab Results  Component Value Date   HGBA1C 5.8 (H) 10/18/2020   HGBA1C 5.7 (H) 05/22/2020   HGBA1C 5.7 (H) 02/21/2020   HGBA1C 5.6 10/19/2019   HGBA1C 6.1 (H) 05/18/2019   Lab Results  Component Value Date   INSULIN 7.1 10/18/2020   INSULIN 15.5 05/22/2020   INSULIN 14.8 02/21/2020   INSULIN 13.3 10/19/2019   INSULIN 15.1 05/18/2019   Lab Results  Component Value Date   TSH 2.530 05/18/2019   Lab Results  Component Value Date   CHOL 184 10/18/2020   HDL 47 10/18/2020   LDLCALC 108 (H) 10/18/2020   TRIG 163 (H) 10/18/2020   Lab Results  Component Value Date   WBC 7.8 05/18/2019   HGB 15.6 05/18/2019   HCT 46.5 05/18/2019   MCV 92 05/18/2019   No results found for: IRON, TIBC, FERRITIN  Attestation Statements:   Reviewed by clinician on day of visit: allergies, medications, problem list, medical history, surgical history, family history, social history, and previous encounter notes.   I, Trixie Dredge, am acting as transcriptionist for Dennard Nip, MD.  I have reviewed the above documentation for accuracy and completeness, and I agree with the above. - Dennard Nip, MD

## 2021-04-26 ENCOUNTER — Ambulatory Visit (INDEPENDENT_AMBULATORY_CARE_PROVIDER_SITE_OTHER): Payer: Self-pay | Admitting: Family Medicine

## 2021-04-27 ENCOUNTER — Other Ambulatory Visit (HOSPITAL_COMMUNITY): Payer: Self-pay

## 2021-04-30 ENCOUNTER — Other Ambulatory Visit (HOSPITAL_COMMUNITY): Payer: Self-pay

## 2021-04-30 MED ORDER — AMPHETAMINE-DEXTROAMPHET ER 15 MG PO CP24
15.0000 mg | ORAL_CAPSULE | Freq: Every morning | ORAL | 0 refills | Status: DC
  Filled 2021-04-30: qty 30, 30d supply, fill #0

## 2021-05-01 ENCOUNTER — Telehealth (INDEPENDENT_AMBULATORY_CARE_PROVIDER_SITE_OTHER): Payer: Self-pay | Admitting: Emergency Medicine

## 2021-05-01 ENCOUNTER — Other Ambulatory Visit (HOSPITAL_COMMUNITY): Payer: Self-pay

## 2021-05-01 NOTE — Telephone Encounter (Signed)
Prior Auth: Approved: Saxenda 18 mg/25ml

## 2021-05-02 ENCOUNTER — Other Ambulatory Visit (HOSPITAL_COMMUNITY): Payer: Self-pay

## 2021-05-16 ENCOUNTER — Other Ambulatory Visit (INDEPENDENT_AMBULATORY_CARE_PROVIDER_SITE_OTHER): Payer: Self-pay | Admitting: Family Medicine

## 2021-05-16 ENCOUNTER — Other Ambulatory Visit (HOSPITAL_COMMUNITY): Payer: Self-pay

## 2021-05-16 DIAGNOSIS — F3289 Other specified depressive episodes: Secondary | ICD-10-CM

## 2021-05-16 NOTE — Telephone Encounter (Signed)
Last OV with Dr. Beasley 

## 2021-05-17 ENCOUNTER — Other Ambulatory Visit (HOSPITAL_COMMUNITY): Payer: Self-pay

## 2021-05-17 NOTE — Telephone Encounter (Signed)
LAST APPOINTMENT DATE: 05/01/2021  NEXT APPOINTMENT DATE: 05/22/2021   [facility] [address] [place] [address] Phone: [phone] Fax: [phone]  Patient is requesting a refill of the following medications: Requested Prescriptions   Pending Prescriptions Disp Refills   desvenlafaxine (PRISTIQ) 50 MG 24 hr tablet 30 tablet 0    Sig: Take 1 tablet (50 mg total) by mouth daily.    Date last filled: 03/27/21 Previously prescribed by [facility]   Lab Results  Component Value Date   HGBA1C 5.8 (H) 10/18/2020   HGBA1C 5.7 (H) 05/22/2020   HGBA1C 5.7 (H) 02/21/2020   Lab Results  Component Value Date   LDLCALC 108 (H) 10/18/2020   CREATININE 0.70 10/18/2020   Lab Results  Component Value Date   VD25OH 68.5 10/18/2020   VD25OH 56.3 05/22/2020   VD25OH 57.8 02/21/2020    BP Readings from Last 3 Encounters:  02/05/21 110/72  12/25/20 120/72  10/18/20 116/73

## 2021-05-18 ENCOUNTER — Other Ambulatory Visit (HOSPITAL_COMMUNITY): Payer: Self-pay

## 2021-05-18 MED ORDER — DESVENLAFAXINE SUCCINATE ER 50 MG PO TB24
50.0000 mg | ORAL_TABLET | Freq: Every day | ORAL | 0 refills | Status: DC
  Filled 2021-05-18: qty 90, 90d supply, fill #0

## 2021-05-19 ENCOUNTER — Other Ambulatory Visit (INDEPENDENT_AMBULATORY_CARE_PROVIDER_SITE_OTHER): Payer: Self-pay | Admitting: Family Medicine

## 2021-05-19 DIAGNOSIS — F3289 Other specified depressive episodes: Secondary | ICD-10-CM

## 2021-05-19 DIAGNOSIS — E559 Vitamin D deficiency, unspecified: Secondary | ICD-10-CM

## 2021-05-21 ENCOUNTER — Other Ambulatory Visit (HOSPITAL_COMMUNITY): Payer: Self-pay

## 2021-05-21 NOTE — Telephone Encounter (Signed)
LAST APPOINTMENT DATE: 05/16/2021  NEXT APPOINTMENT DATE: 05/22/2021   [facility] [address] [place] [address] Phone: [phone] Fax: [phone]  Patient is requesting a refill of the following medications: Requested Prescriptions   Pending Prescriptions Disp Refills   topiramate (TOPAMAX) 50 MG tablet 30 tablet 0    Sig: Take 1 tablet (50 mg total) by mouth daily.   Vitamin D, Ergocalciferol, (DRISDOL) 1.25 MG (50000 UNIT) CAPS capsule 4 capsule 0    Sig: Take 1 capsule (50,000 Units total) by mouth once a week.    Date last filled: 04/23/21 Previously prescribed by [facility]  Lab Results  Component Value Date   HGBA1C 5.8 (H) 10/18/2020   HGBA1C 5.7 (H) 05/22/2020   HGBA1C 5.7 (H) 02/21/2020   Lab Results  Component Value Date   LDLCALC 108 (H) 10/18/2020   CREATININE 0.70 10/18/2020   Lab Results  Component Value Date   VD25OH 68.5 10/18/2020   VD25OH 56.3 05/22/2020   VD25OH 57.8 02/21/2020    BP Readings from Last 3 Encounters:  02/05/21 110/72  12/25/20 120/72  10/18/20 116/73

## 2021-05-22 ENCOUNTER — Ambulatory Visit (INDEPENDENT_AMBULATORY_CARE_PROVIDER_SITE_OTHER): Payer: 59 | Admitting: Family Medicine

## 2021-05-22 ENCOUNTER — Other Ambulatory Visit: Payer: Self-pay

## 2021-05-22 ENCOUNTER — Encounter (INDEPENDENT_AMBULATORY_CARE_PROVIDER_SITE_OTHER): Payer: Self-pay | Admitting: Family Medicine

## 2021-05-22 ENCOUNTER — Other Ambulatory Visit (HOSPITAL_COMMUNITY): Payer: Self-pay

## 2021-05-22 VITALS — BP 124/74 | HR 89 | Temp 97.6°F | Ht 63.0 in | Wt 185.0 lb

## 2021-05-22 DIAGNOSIS — F3289 Other specified depressive episodes: Secondary | ICD-10-CM

## 2021-05-22 DIAGNOSIS — R7303 Prediabetes: Secondary | ICD-10-CM | POA: Diagnosis not present

## 2021-05-22 DIAGNOSIS — Z9189 Other specified personal risk factors, not elsewhere classified: Secondary | ICD-10-CM | POA: Diagnosis not present

## 2021-05-22 DIAGNOSIS — E559 Vitamin D deficiency, unspecified: Secondary | ICD-10-CM | POA: Diagnosis not present

## 2021-05-22 DIAGNOSIS — Z6838 Body mass index (BMI) 38.0-38.9, adult: Secondary | ICD-10-CM

## 2021-05-22 MED ORDER — VITAMIN D (ERGOCALCIFEROL) 1.25 MG (50000 UNIT) PO CAPS
50000.0000 [IU] | ORAL_CAPSULE | ORAL | 0 refills | Status: DC
  Filled 2021-05-22 – 2021-05-25 (×2): qty 4, 28d supply, fill #0

## 2021-05-22 MED ORDER — LIRAGLUTIDE -WEIGHT MANAGEMENT 18 MG/3ML ~~LOC~~ SOPN
3.0000 mg | PEN_INJECTOR | Freq: Every day | SUBCUTANEOUS | 0 refills | Status: DC
  Filled 2021-05-22 – 2021-06-12 (×2): qty 15, 30d supply, fill #0

## 2021-05-22 MED ORDER — METFORMIN HCL 500 MG PO TABS
500.0000 mg | ORAL_TABLET | Freq: Two times a day (BID) | ORAL | 0 refills | Status: DC
  Filled 2021-05-22: qty 60, 30d supply, fill #0

## 2021-05-22 MED ORDER — TOPIRAMATE 50 MG PO TABS
50.0000 mg | ORAL_TABLET | Freq: Every day | ORAL | 0 refills | Status: DC
Start: 2021-05-22 — End: 2021-08-28
  Filled 2021-05-22 – 2021-05-25 (×2): qty 30, 30d supply, fill #0

## 2021-05-25 ENCOUNTER — Other Ambulatory Visit (HOSPITAL_COMMUNITY): Payer: Self-pay

## 2021-05-25 MED ORDER — AMPHETAMINE-DEXTROAMPHET ER 15 MG PO CP24
15.0000 mg | ORAL_CAPSULE | Freq: Every morning | ORAL | 0 refills | Status: AC
  Filled 2021-06-08: qty 30, 30d supply, fill #0

## 2021-05-29 NOTE — Progress Notes (Signed)
Chief Complaint:   OBESITY Melissa [name] is here to discuss her progress with her obesity treatment plan along with follow-up of her obesity related diagnoses. Melissa [name] is on the Category 2 Plan and states she is following her eating plan approximately 75% of the time. Melissa [name] states she is walking for 30 minutes 3-4 times per week.  Today's visit was #: 13 Starting weight: 219 lbs Starting date: 05/18/2019 Today's weight: 185 lbs Today's date: 05/22/2021 Total lbs lost to date: 34 Total lbs lost since last in-office visit: 0  Interim History: Melissa [name] continues to do well maintaining her weight loss. She has had some extra challenges, but she has gotten back on track. She notes some increased PM cravings.  Subjective:   1. Pre-diabetes Melissa [name] is working on diet and exercise.  2. Vitamin D deficiency Melissa [name] is stable on Vit D.  3. Other depression with emotional eating Melissa [name] notes increased PM cravings. She has tolerated Topamax well.  4. At risk for diabetes mellitus Melissa [name] is at higher than average risk for developing diabetes due to obesity.   Assessment/Plan:   1. Pre-diabetes Melissa [name] Melissa [name] continue to work on weight loss, exercise, and decreasing simple carbohydrates to help decrease the risk of diabetes. We Melissa [name] refill metformin for 1 month, and we Melissa [name] recheck labs in 1 month.  - metFORMIN (GLUCOPHAGE) 500 MG tablet; Take 1 tablet (500 mg total) by mouth 2 (two) times daily with a meal.  Dispense: 60 tablet; Refill: 0  2. Vitamin D deficiency Low Vitamin D level contributes to fatigue and are associated with obesity, breast, and colon cancer. We Melissa [name] refill prescription Vitamin D for 1 month, and we Melissa [name] recheck labs in 1 month. Melissa [name] Melissa [name] follow-up for routine testing of Vitamin D, at least 2-3 times per year to avoid over-replacement.  - Vitamin D, Ergocalciferol, (DRISDOL) 1.25 MG (50000 UNIT) CAPS capsule; Take 1 capsule (50,000 Units total) by mouth once a week.  Dispense: 4  capsule; Refill: 0  3. Other depression with emotional eating Behavior modification techniques were discussed today to help Melissa [name] deal with her emotional/non-hunger eating behaviors. We Melissa [name] refill Topamax for 1 month. Melissa [name] was encouraged to eat all of the food especially protein on her plan to help decrease cravings. Orders and follow up as documented in patient record.   - topiramate (TOPAMAX) 50 MG tablet; Take 1 tablet (50 mg total) by mouth daily.  Dispense: 30 tablet; Refill: 0  4. At risk for diabetes mellitus Melissa [name] was given approximately 15 minutes of diabetes education and counseling today. We discussed intensive lifestyle modifications today with an emphasis on weight loss as well as increasing exercise and decreasing simple carbohydrates in her diet. We also reviewed medication options with an emphasis on risk versus benefit of those discussed.   Repetitive spaced learning was employed today to elicit superior memory formation and behavioral change.  5. Obesity with current BMI of 32.8 Melissa [name] is currently in the action stage of change. As such, her goal is to continue with weight loss efforts. She has agreed to the Category 2 Plan.   We Melissa [name] recheck fasting labs at her next visit.  We discussed various medication options to help Melissa [name] with her weight loss efforts and we both agreed to continue Saxenda 3.0 mg and we Melissa [name] refill for 1 month.  - Liraglutide -Weight Management 18 MG/3ML SOPN; Inject 3 mg into the skin daily.  Dispense: 15 mL; Refill: 0  Exercise goals: As is.  Behavioral  modification strategies: increasing lean protein intake, better snacking choices, and emotional eating strategies.  Docie has agreed to follow-up with our clinic in 4 weeks. She was informed of the importance of frequent follow-up visits to maximize her success with intensive lifestyle modifications for her multiple health conditions.   Objective:   Blood pressure 124/74, pulse 89, temperature  97.6 F (36.4 C), height '5\' 3"'$  (1.6 m), weight 185 lb (83.9 kg), SpO2 98 %. Body mass index is 32.77 kg/m.  General: Cooperative, alert, well developed, in no acute distress. HEENT: Conjunctivae and lids unremarkable. Cardiovascular: Regular rhythm.  Lungs: Normal work of breathing. Neurologic: No focal deficits.   Lab Results  Component Value Date   CREATININE 0.70 10/18/2020   BUN 15 10/18/2020   NA 140 10/18/2020   K 4.5 10/18/2020   CL 106 10/18/2020   CO2 24 10/18/2020   Lab Results  Component Value Date   ALT 38 (H) 10/18/2020   AST 22 10/18/2020   ALKPHOS 68 10/18/2020   BILITOT 0.8 10/18/2020   Lab Results  Component Value Date   HGBA1C 5.8 (H) 10/18/2020   HGBA1C 5.7 (H) 05/22/2020   HGBA1C 5.7 (H) 02/21/2020   HGBA1C 5.6 10/19/2019   HGBA1C 6.1 (H) 05/18/2019   Lab Results  Component Value Date   INSULIN 7.1 10/18/2020   INSULIN 15.5 05/22/2020   INSULIN 14.8 02/21/2020   INSULIN 13.3 10/19/2019   INSULIN 15.1 05/18/2019   Lab Results  Component Value Date   TSH 2.530 05/18/2019   Lab Results  Component Value Date   CHOL 184 10/18/2020   HDL 47 10/18/2020   LDLCALC 108 (H) 10/18/2020   TRIG 163 (H) 10/18/2020   Lab Results  Component Value Date   VD25OH 68.5 10/18/2020   VD25OH 56.3 05/22/2020   VD25OH 57.8 02/21/2020   Lab Results  Component Value Date   WBC 7.8 05/18/2019   HGB 15.6 05/18/2019   HCT 46.5 05/18/2019   MCV 92 05/18/2019   No results found for: IRON, TIBC, FERRITIN  Attestation Statements:   Reviewed by clinician on day of visit: allergies, medications, problem list, medical history, surgical history, family history, social history, and previous encounter notes.   I, Trixie Dredge, am acting as transcriptionist for Dennard Nip, MD.  I have reviewed the above documentation for accuracy and completeness, and I agree with the above. -  Dennard Nip, MD

## 2021-05-30 DIAGNOSIS — F900 Attention-deficit hyperactivity disorder, predominantly inattentive type: Secondary | ICD-10-CM | POA: Diagnosis not present

## 2021-05-30 DIAGNOSIS — F3341 Major depressive disorder, recurrent, in partial remission: Secondary | ICD-10-CM | POA: Diagnosis not present

## 2021-06-08 ENCOUNTER — Other Ambulatory Visit (HOSPITAL_COMMUNITY): Payer: Self-pay

## 2021-06-12 ENCOUNTER — Other Ambulatory Visit (HOSPITAL_COMMUNITY): Payer: Self-pay

## 2021-06-21 ENCOUNTER — Other Ambulatory Visit (INDEPENDENT_AMBULATORY_CARE_PROVIDER_SITE_OTHER): Payer: Self-pay | Admitting: Family Medicine

## 2021-06-21 ENCOUNTER — Other Ambulatory Visit (HOSPITAL_COMMUNITY): Payer: Self-pay

## 2021-06-21 DIAGNOSIS — Z6838 Body mass index (BMI) 38.0-38.9, adult: Secondary | ICD-10-CM

## 2021-06-22 ENCOUNTER — Other Ambulatory Visit (HOSPITAL_COMMUNITY): Payer: Self-pay

## 2021-06-25 ENCOUNTER — Other Ambulatory Visit (HOSPITAL_COMMUNITY): Payer: Self-pay

## 2021-06-25 ENCOUNTER — Encounter (INDEPENDENT_AMBULATORY_CARE_PROVIDER_SITE_OTHER): Payer: Self-pay

## 2021-06-25 MED ORDER — INSULIN PEN NEEDLE 32G X 4 MM MISC
0 refills | Status: DC
  Filled 2021-06-25: qty 100, fill #0

## 2021-06-25 NOTE — Telephone Encounter (Signed)
Dr.Beasley 

## 2021-06-26 ENCOUNTER — Ambulatory Visit (INDEPENDENT_AMBULATORY_CARE_PROVIDER_SITE_OTHER): Payer: 59 | Admitting: Family Medicine

## 2021-07-01 ENCOUNTER — Other Ambulatory Visit (INDEPENDENT_AMBULATORY_CARE_PROVIDER_SITE_OTHER): Payer: Self-pay | Admitting: Family Medicine

## 2021-07-01 DIAGNOSIS — F3289 Other specified depressive episodes: Secondary | ICD-10-CM

## 2021-07-02 ENCOUNTER — Other Ambulatory Visit (HOSPITAL_COMMUNITY): Payer: Self-pay

## 2021-07-05 ENCOUNTER — Other Ambulatory Visit (HOSPITAL_COMMUNITY): Payer: Self-pay

## 2021-07-05 MED ORDER — AMPHETAMINE-DEXTROAMPHET ER 15 MG PO CP24
15.0000 mg | ORAL_CAPSULE | Freq: Every morning | ORAL | 0 refills | Status: DC
  Filled 2021-07-12: qty 30, 30d supply, fill #0

## 2021-07-12 ENCOUNTER — Other Ambulatory Visit (HOSPITAL_COMMUNITY): Payer: Self-pay

## 2021-08-05 ENCOUNTER — Other Ambulatory Visit (HOSPITAL_COMMUNITY): Payer: Self-pay

## 2021-08-06 ENCOUNTER — Other Ambulatory Visit (HOSPITAL_COMMUNITY): Payer: Self-pay

## 2021-08-07 ENCOUNTER — Other Ambulatory Visit (HOSPITAL_COMMUNITY): Payer: Self-pay

## 2021-08-09 ENCOUNTER — Other Ambulatory Visit (HOSPITAL_COMMUNITY): Payer: Self-pay

## 2021-08-13 ENCOUNTER — Other Ambulatory Visit (HOSPITAL_COMMUNITY): Payer: Self-pay

## 2021-08-13 MED ORDER — AMPHETAMINE-DEXTROAMPHET ER 15 MG PO CP24
15.0000 mg | ORAL_CAPSULE | Freq: Every morning | ORAL | 0 refills | Status: DC
  Filled 2021-08-13: qty 30, 30d supply, fill #0

## 2021-08-13 MED ORDER — DESVENLAFAXINE SUCCINATE ER 50 MG PO TB24
50.0000 mg | ORAL_TABLET | Freq: Every day | ORAL | 0 refills | Status: AC
  Filled 2021-08-13: qty 90, 90d supply, fill #0

## 2021-08-14 ENCOUNTER — Other Ambulatory Visit (HOSPITAL_COMMUNITY): Payer: Self-pay

## 2021-08-14 MED ORDER — CARESTART COVID-19 HOME TEST VI KIT
PACK | 0 refills | Status: DC
  Filled 2021-08-14: qty 4, 4d supply, fill #0

## 2021-08-28 ENCOUNTER — Encounter (INDEPENDENT_AMBULATORY_CARE_PROVIDER_SITE_OTHER): Payer: Self-pay | Admitting: Family Medicine

## 2021-08-28 ENCOUNTER — Other Ambulatory Visit (HOSPITAL_COMMUNITY): Payer: Self-pay

## 2021-08-28 ENCOUNTER — Other Ambulatory Visit: Payer: Self-pay

## 2021-08-28 ENCOUNTER — Ambulatory Visit (INDEPENDENT_AMBULATORY_CARE_PROVIDER_SITE_OTHER): Payer: 59 | Admitting: Family Medicine

## 2021-08-28 VITALS — BP 126/76 | HR 70 | Temp 97.3°F | Ht 63.0 in | Wt 189.0 lb

## 2021-08-28 DIAGNOSIS — Z6838 Body mass index (BMI) 38.0-38.9, adult: Secondary | ICD-10-CM | POA: Diagnosis not present

## 2021-08-28 DIAGNOSIS — E559 Vitamin D deficiency, unspecified: Secondary | ICD-10-CM

## 2021-08-28 DIAGNOSIS — F3289 Other specified depressive episodes: Secondary | ICD-10-CM | POA: Diagnosis not present

## 2021-08-28 DIAGNOSIS — Z9189 Other specified personal risk factors, not elsewhere classified: Secondary | ICD-10-CM | POA: Diagnosis not present

## 2021-08-28 DIAGNOSIS — R7303 Prediabetes: Secondary | ICD-10-CM

## 2021-08-28 MED ORDER — VITAMIN D (ERGOCALCIFEROL) 1.25 MG (50000 UNIT) PO CAPS
50000.0000 [IU] | ORAL_CAPSULE | ORAL | 0 refills | Status: DC
  Filled 2021-08-28: qty 4, 28d supply, fill #0

## 2021-08-28 MED ORDER — TOPIRAMATE 50 MG PO TABS
50.0000 mg | ORAL_TABLET | Freq: Every day | ORAL | 0 refills | Status: DC
  Filled 2021-08-28: qty 30, 30d supply, fill #0

## 2021-08-28 MED ORDER — METFORMIN HCL 500 MG PO TABS
500.0000 mg | ORAL_TABLET | Freq: Two times a day (BID) | ORAL | 0 refills | Status: DC
  Filled 2021-08-28: qty 60, 30d supply, fill #0

## 2021-08-28 MED ORDER — LIRAGLUTIDE -WEIGHT MANAGEMENT 18 MG/3ML ~~LOC~~ SOPN
3.0000 mg | PEN_INJECTOR | Freq: Every day | SUBCUTANEOUS | 0 refills | Status: DC
  Filled 2021-08-28: qty 15, 30d supply, fill #0

## 2021-08-29 NOTE — Progress Notes (Signed)
Chief Complaint:   OBESITY Melissa [name] is here to discuss her progress with her obesity treatment plan along with follow-up of her obesity related diagnoses. Melissa [name] is on the Category 2 Plan and states she is following her eating plan approximately 50% of the time. Melissa [name] states she is walking at work 7 times per week.  Today's visit was #: 28 Starting weight: 219 lbs Starting date: 7/14/220 Today's weight: 189 lbs Today's date: 08/28/2021 Total lbs lost to date: 30 Total lbs lost since last in-office visit: 0  Interim History: Melissa [name] has a new job and she is working on getting into her new routine. She notes increased temptations at her new job and she is working on avoiding eating out. She is working on packing her lunch so this should help.  Subjective:   1. Pre-diabetes Melissa [name] is working on diet and exercise. She is stable on metformin and GLP-1.  2. Vitamin D deficiency Melissa [name] is stable on Vit D, and she denies signs of over-replacement.  3. Other depression with emotional eating Melissa [name] is doing well on topiramate, and no side effects were noted.  4. At risk for diabetes mellitus Melissa [name] is at higher than average risk for developing diabetes due to obesity.   Assessment/Plan:   1. Pre-diabetes Melissa [name] will continue to work on weight loss, exercise, and decreasing simple carbohydrates to help decrease the risk of diabetes. We will refill metformin for 1 month.  - metFORMIN (GLUCOPHAGE) 500 MG tablet; Take 1 tablet (500 mg total) by mouth 2 (two) times daily with a meal.  Dispense: 60 tablet; Refill: 0  2. Vitamin D deficiency Low Vitamin D level contributes to fatigue and are associated with obesity, breast, and colon cancer. We will refill prescription Vitamin D for 1 month. Melissa [name] will follow-up for routine testing of Vitamin D, at least 2-3 times per year to avoid over-replacement.  - Vitamin D, Ergocalciferol, (DRISDOL) 1.25 MG (50000 UNIT) CAPS capsule; Take 1 capsule (50,000  Units total) by mouth once a week.  Dispense: 4 capsule; Refill: 0  3. Other depression with emotional eating Behavior modification techniques were discussed today to help Melissa [name] deal with her emotional/non-hunger eating behaviors. We will refill topiramate for 1 month. Orders and follow up as documented in patient record.   - topiramate (TOPAMAX) 50 MG tablet; Take 1 tablet (50 mg total) by mouth daily.  Dispense: 30 tablet; Refill: 0  4. At risk for diabetes mellitus Melissa [name] was given approximately 15 minutes of diabetes education and counseling today. We discussed intensive lifestyle modifications today with an emphasis on weight loss as well as increasing exercise and decreasing simple carbohydrates in her diet. We also reviewed medication options with an emphasis on risk versus benefit of those discussed.   Repetitive spaced learning was employed today to elicit superior memory formation and behavioral change.  5. Obesity with current BMI of 32.8 Melissa [name] is currently in the action stage of change. As such, her goal is to continue with weight loss efforts. She has agreed to the Category 2 Plan.   Protein rich soup recipes were discussed.  We discussed various medication options to help Melissa [name] with her weight loss efforts and we both agreed to continues Saxenda at 3 mg, and we will refill for 1 month.  - Liraglutide -Weight Management 18 MG/3ML SOPN; Inject 3 mg into the skin daily.  Dispense: 15 mL; Refill: 0  Exercise goals: As is.  Behavioral modification strategies: increasing lean protein intake.  Melissa [name]  has agreed to follow-up with our clinic in 4 weeks. She was informed of the importance of frequent follow-up visits to maximize her success with intensive lifestyle modifications for her multiple health conditions.   Objective:   Blood pressure 126/76, pulse 70, temperature (!) 97.3 F (36.3 C), height 5\' 3"  (1.6 m), weight 189 lb (85.7 kg), SpO2 99 %. Body mass index is 33.48  kg/m.  General: Cooperative, alert, well developed, in no acute distress. HEENT: Conjunctivae and lids unremarkable. Cardiovascular: Regular rhythm.  Lungs: Normal work of breathing. Neurologic: No focal deficits.   Lab Results  Component Value Date   CREATININE 0.70 10/18/2020   BUN 15 10/18/2020   NA 140 10/18/2020   K 4.5 10/18/2020   CL 106 10/18/2020   CO2 24 10/18/2020   Lab Results  Component Value Date   ALT 38 (H) 10/18/2020   AST 22 10/18/2020   ALKPHOS 68 10/18/2020   BILITOT 0.8 10/18/2020   Lab Results  Component Value Date   HGBA1C 5.8 (H) 10/18/2020   HGBA1C 5.7 (H) 05/22/2020   HGBA1C 5.7 (H) 02/21/2020   HGBA1C 5.6 10/19/2019   HGBA1C 6.1 (H) 05/18/2019   Lab Results  Component Value Date   INSULIN 7.1 10/18/2020   INSULIN 15.5 05/22/2020   INSULIN 14.8 02/21/2020   INSULIN 13.3 10/19/2019   INSULIN 15.1 05/18/2019   Lab Results  Component Value Date   TSH 2.530 05/18/2019   Lab Results  Component Value Date   CHOL 184 10/18/2020   HDL 47 10/18/2020   LDLCALC 108 (H) 10/18/2020   TRIG 163 (H) 10/18/2020   Lab Results  Component Value Date   VD25OH 68.5 10/18/2020   VD25OH 56.3 05/22/2020   VD25OH 57.8 02/21/2020   Lab Results  Component Value Date   WBC 7.8 05/18/2019   HGB 15.6 05/18/2019   HCT 46.5 05/18/2019   MCV 92 05/18/2019   No results found for: IRON, TIBC, FERRITIN  Attestation Statements:   Reviewed by clinician on day of visit: allergies, medications, problem list, medical history, surgical history, family history, social history, and previous encounter notes.   I, Trixie Dredge, am acting as transcriptionist for Dennard Nip, MD.  I have reviewed the above documentation for accuracy and completeness, and I agree with the above. -  Dennard Nip, MD

## 2021-09-06 DIAGNOSIS — F900 Attention-deficit hyperactivity disorder, predominantly inattentive type: Secondary | ICD-10-CM | POA: Diagnosis not present

## 2021-09-06 DIAGNOSIS — F3341 Major depressive disorder, recurrent, in partial remission: Secondary | ICD-10-CM | POA: Diagnosis not present

## 2021-10-06 ENCOUNTER — Other Ambulatory Visit (INDEPENDENT_AMBULATORY_CARE_PROVIDER_SITE_OTHER): Payer: Self-pay | Admitting: Family Medicine

## 2021-10-06 DIAGNOSIS — F3289 Other specified depressive episodes: Secondary | ICD-10-CM

## 2021-10-08 ENCOUNTER — Other Ambulatory Visit: Payer: Self-pay

## 2021-10-08 ENCOUNTER — Ambulatory Visit (INDEPENDENT_AMBULATORY_CARE_PROVIDER_SITE_OTHER): Payer: 59 | Admitting: Family Medicine

## 2021-10-08 ENCOUNTER — Encounter (INDEPENDENT_AMBULATORY_CARE_PROVIDER_SITE_OTHER): Payer: Self-pay | Admitting: Family Medicine

## 2021-10-08 ENCOUNTER — Other Ambulatory Visit (HOSPITAL_COMMUNITY): Payer: Self-pay

## 2021-10-08 VITALS — BP 131/73 | HR 73 | Temp 97.9°F | Ht 63.0 in | Wt 185.0 lb

## 2021-10-08 DIAGNOSIS — R7303 Prediabetes: Secondary | ICD-10-CM

## 2021-10-08 DIAGNOSIS — Z6838 Body mass index (BMI) 38.0-38.9, adult: Secondary | ICD-10-CM | POA: Diagnosis not present

## 2021-10-08 DIAGNOSIS — Z9189 Other specified personal risk factors, not elsewhere classified: Secondary | ICD-10-CM

## 2021-10-08 DIAGNOSIS — F3289 Other specified depressive episodes: Secondary | ICD-10-CM

## 2021-10-08 MED ORDER — METFORMIN HCL 500 MG PO TABS
500.0000 mg | ORAL_TABLET | Freq: Two times a day (BID) | ORAL | 0 refills | Status: DC
  Filled 2021-10-08: qty 60, 30d supply, fill #0

## 2021-10-08 MED ORDER — TOPIRAMATE 50 MG PO TABS
50.0000 mg | ORAL_TABLET | Freq: Every day | ORAL | 0 refills | Status: DC
  Filled 2021-10-08: qty 30, 30d supply, fill #0

## 2021-10-08 MED ORDER — LIRAGLUTIDE -WEIGHT MANAGEMENT 18 MG/3ML ~~LOC~~ SOPN
3.0000 mg | PEN_INJECTOR | Freq: Every day | SUBCUTANEOUS | 0 refills | Status: DC
  Filled 2021-10-08: qty 15, 30d supply, fill #0

## 2021-10-08 NOTE — Progress Notes (Signed)
Chief Complaint:   OBESITY Melissa [name] is here to discuss her progress with her obesity treatment plan along with follow-up of her obesity related diagnoses. Melissa [name] is on the Category 2 Plan and states she is following her eating plan approximately 50% of the time. Melissa [name] states she is walking 5 times per week.  Today's visit was #: 24 Starting weight: 219 lbs Starting date: 05/18/2019 Today's weight: 185 lbs Today's date: 10/08/2021 Total lbs lost to date: 34 Total lbs lost since last in-office visit: 4  Interim History: Melissa [name] continues to do well with weight loss even over Thanksgiving. Her hunger is mostly controlled but she has skipped some meals. She is on Saxenda at 1.8 mg.  Subjective:   1. Pre-diabetes Melissa [name] is working on diet, and she is stable on metformin and GLP-1.  2. Other depression with emotional eating Melissa [name] is working on decreasing emotional eating behaviors. She has struggled more with meal planning recently. She denies change in sleep.  3. At risk for diarrhea Melissa [name] is at risk for diarrhea if simple carbohydrates increase on GLP-1 and metformin.  Assessment/Plan:   1. Pre-diabetes We will refill metformin for 1 month, and we will recheck labs in 1 month. [place] will continue to work on weight loss, exercise, and decreasing simple carbohydrates to help decrease the risk of diabetes.   - metFORMIN (GLUCOPHAGE) 500 MG tablet; Take 1 tablet (500 mg total) by mouth 2 (two) times daily with a meal.  Dispense: 60 tablet; Refill: 0  2. Other depression with emotional eating Behavior modification techniques were discussed today to help Melissa [name] deal with her emotional/non-hunger eating behaviors. We will refill Topamax for 1 month. Orders and follow up as documented in patient record.   - topiramate (TOPAMAX) 50 MG tablet; Take 1 tablet (50 mg total) by mouth daily.  Dispense: 30 tablet; Refill: 0  3. At risk for diarrhea Melissa [name] was given approximately 15 minutes of  diarrhea prevention counseling today. She is 50 y.o. female and has risk factors for diarrhea including medications and changes in diet. We discussed intensive lifestyle modifications today with an emphasis on specific weight loss instructions including dietary strategies.   Repetitive spaced learning was employed today to elicit superior memory formation and behavioral change.  4. Obesity with current BMI of 32.8 Melissa [name] is currently in the action stage of change. As such, her goal is to continue with weight loss efforts. She has agreed to the Category 2 Plan.   We discussed various medication options to help Melissa [name] with her weight loss efforts and we both agreed to continue Saxenda, and we will refill for 1 month.  - Liraglutide -Weight Management 18 MG/3ML SOPN; Inject 3 mg into the skin daily.  Dispense: 15 mL; Refill: 0  Exercise goals: As is.  Behavioral modification strategies: increasing lean protein intake and no skipping meals.  Melissa [name] has agreed to follow-up with our clinic in 4 weeks. She was informed of the importance of frequent follow-up visits to maximize her success with intensive lifestyle modifications for her multiple health conditions.   Objective:   Blood pressure 131/73, pulse 73, temperature 97.9 F (36.6 C), height 5\' 3"  (1.6 m), weight 185 lb (83.9 kg), SpO2 100 %. Body mass index is 32.77 kg/m.  General: Cooperative, alert, well developed, in no acute distress. HEENT: Conjunctivae and lids unremarkable. Cardiovascular: Regular rhythm.  Lungs: Normal work of breathing. Neurologic: No focal deficits.   Lab Results  Component Value Date   CREATININE  0.70 10/18/2020   BUN 15 10/18/2020   NA 140 10/18/2020   K 4.5 10/18/2020   CL 106 10/18/2020   CO2 24 10/18/2020   Lab Results  Component Value Date   ALT 38 (H) 10/18/2020   AST 22 10/18/2020   ALKPHOS 68 10/18/2020   BILITOT 0.8 10/18/2020   Lab Results  Component Value Date   HGBA1C 5.8 (H)  10/18/2020   HGBA1C 5.7 (H) 05/22/2020   HGBA1C 5.7 (H) 02/21/2020   HGBA1C 5.6 10/19/2019   HGBA1C 6.1 (H) 05/18/2019   Lab Results  Component Value Date   INSULIN 7.1 10/18/2020   INSULIN 15.5 05/22/2020   INSULIN 14.8 02/21/2020   INSULIN 13.3 10/19/2019   INSULIN 15.1 05/18/2019   Lab Results  Component Value Date   TSH 2.530 05/18/2019   Lab Results  Component Value Date   CHOL 184 10/18/2020   HDL 47 10/18/2020   LDLCALC 108 (H) 10/18/2020   TRIG 163 (H) 10/18/2020   Lab Results  Component Value Date   VD25OH 68.5 10/18/2020   VD25OH 56.3 05/22/2020   VD25OH 57.8 02/21/2020   Lab Results  Component Value Date   WBC 7.8 05/18/2019   HGB 15.6 05/18/2019   HCT 46.5 05/18/2019   MCV 92 05/18/2019   No results found for: IRON, TIBC, FERRITIN  Attestation Statements:   Reviewed by clinician on day of visit: allergies, medications, problem list, medical history, surgical history, family history, social history, and previous encounter notes.   I, Trixie Dredge, am acting as transcriptionist for Dennard Nip, MD.  I have reviewed the above documentation for accuracy and completeness, and I agree with the above. -  Dennard Nip, MD

## 2021-10-26 ENCOUNTER — Other Ambulatory Visit (HOSPITAL_COMMUNITY): Payer: Self-pay

## 2021-10-26 MED ORDER — AMPHETAMINE-DEXTROAMPHET ER 15 MG PO CP24
15.0000 mg | ORAL_CAPSULE | Freq: Every morning | ORAL | 0 refills | Status: DC
  Filled 2021-10-26: qty 30, 30d supply, fill #0

## 2021-11-03 ENCOUNTER — Other Ambulatory Visit (HOSPITAL_COMMUNITY): Payer: Self-pay

## 2021-11-03 ENCOUNTER — Other Ambulatory Visit (INDEPENDENT_AMBULATORY_CARE_PROVIDER_SITE_OTHER): Payer: Self-pay | Admitting: Family Medicine

## 2021-11-03 DIAGNOSIS — F3289 Other specified depressive episodes: Secondary | ICD-10-CM

## 2021-11-05 ENCOUNTER — Other Ambulatory Visit (HOSPITAL_COMMUNITY): Payer: Self-pay

## 2021-11-06 ENCOUNTER — Other Ambulatory Visit (HOSPITAL_COMMUNITY): Payer: Self-pay

## 2021-11-06 MED ORDER — TOPIRAMATE 50 MG PO TABS
50.0000 mg | ORAL_TABLET | Freq: Every day | ORAL | 0 refills | Status: DC
  Filled 2021-11-06: qty 30, 30d supply, fill #0

## 2021-11-06 NOTE — Telephone Encounter (Signed)
LAST APPOINTMENT DATE: 10/08/21 NEXT APPOINTMENT DATE: 11/13/21   [facility] [address] [place] [address] Phone: [phone] Fax: [phone]  Patient is requesting a refill of the following medications: Requested Prescriptions   Pending Prescriptions Disp Refills   topiramate (TOPAMAX) 50 MG tablet 30 tablet 0    Sig: Take 1 tablet (50 mg total) by mouth daily.    Date last filled: 10/08/21 Previously prescribed by Dr. Leafy Ro  Lab Results  Component Value Date   HGBA1C 5.8 (H) 10/18/2020   HGBA1C 5.7 (H) 05/22/2020   HGBA1C 5.7 (H) 02/21/2020   Lab Results  Component Value Date   LDLCALC 108 (H) 10/18/2020   CREATININE 0.70 10/18/2020   Lab Results  Component Value Date   VD25OH 68.5 10/18/2020   VD25OH 56.3 05/22/2020   VD25OH 57.8 02/21/2020    BP Readings from Last 3 Encounters:  10/08/21 131/73  08/28/21 126/76  05/22/21 124/74

## 2021-11-13 ENCOUNTER — Encounter (INDEPENDENT_AMBULATORY_CARE_PROVIDER_SITE_OTHER): Payer: Self-pay | Admitting: Family Medicine

## 2021-11-13 ENCOUNTER — Other Ambulatory Visit: Payer: Self-pay

## 2021-11-13 ENCOUNTER — Ambulatory Visit (INDEPENDENT_AMBULATORY_CARE_PROVIDER_SITE_OTHER): Payer: 59 | Admitting: Family Medicine

## 2021-11-13 ENCOUNTER — Other Ambulatory Visit (HOSPITAL_COMMUNITY): Payer: Self-pay

## 2021-11-13 VITALS — BP 106/71 | HR 71 | Temp 97.5°F | Ht 63.0 in | Wt 181.0 lb

## 2021-11-13 DIAGNOSIS — E7800 Pure hypercholesterolemia, unspecified: Secondary | ICD-10-CM | POA: Diagnosis not present

## 2021-11-13 DIAGNOSIS — Z6838 Body mass index (BMI) 38.0-38.9, adult: Secondary | ICD-10-CM

## 2021-11-13 DIAGNOSIS — E559 Vitamin D deficiency, unspecified: Secondary | ICD-10-CM | POA: Diagnosis not present

## 2021-11-13 DIAGNOSIS — R7303 Prediabetes: Secondary | ICD-10-CM

## 2021-11-13 DIAGNOSIS — Z9189 Other specified personal risk factors, not elsewhere classified: Secondary | ICD-10-CM | POA: Diagnosis not present

## 2021-11-13 DIAGNOSIS — Z6832 Body mass index (BMI) 32.0-32.9, adult: Secondary | ICD-10-CM | POA: Diagnosis not present

## 2021-11-13 MED ORDER — LIRAGLUTIDE -WEIGHT MANAGEMENT 18 MG/3ML ~~LOC~~ SOPN
3.0000 mg | PEN_INJECTOR | Freq: Every day | SUBCUTANEOUS | 0 refills | Status: DC
  Filled 2021-11-13: qty 15, 30d supply, fill #0

## 2021-11-13 MED ORDER — VITAMIN D (ERGOCALCIFEROL) 1.25 MG (50000 UNIT) PO CAPS
50000.0000 [IU] | ORAL_CAPSULE | ORAL | 0 refills | Status: DC
Start: 2021-11-13 — End: 2022-01-16
  Filled 2021-11-13: qty 4, 28d supply, fill #0

## 2021-11-13 MED ORDER — METFORMIN HCL 500 MG PO TABS
500.0000 mg | ORAL_TABLET | Freq: Two times a day (BID) | ORAL | 0 refills | Status: DC
  Filled 2021-11-13: qty 60, 30d supply, fill #0

## 2021-11-13 MED ORDER — INSULIN PEN NEEDLE 32G X 4 MM MISC
0 refills | Status: DC
  Filled 2021-11-13: qty 100, 90d supply, fill #0

## 2021-11-14 ENCOUNTER — Other Ambulatory Visit (HOSPITAL_COMMUNITY): Payer: Self-pay

## 2021-11-14 LAB — LIPID PANEL WITH LDL/HDL RATIO
Cholesterol, Total: 219 mg/dL — ABNORMAL HIGH (ref 100–199)
HDL: 49 mg/dL (ref >39)
LDL Chol Calc (NIH): 144 mg/dL — ABNORMAL HIGH (ref 0–99)
LDL/HDL Ratio: 2.9 ratio (ref 0.0–3.2)
Triglycerides: 146 mg/dL (ref 0–149)
VLDL Cholesterol Cal: 26 mg/dL (ref 5–40)

## 2021-11-14 LAB — CMP14+EGFR
ALT: 23 IU/L (ref 0–32)
AST: 17 IU/L (ref 0–40)
Albumin/Globulin Ratio: 2 (ref 1.2–2.2)
Albumin: 4.6 g/dL (ref 3.8–4.9)
Alkaline Phosphatase: 67 IU/L (ref 44–121)
BUN/Creatinine Ratio: 28 — ABNORMAL HIGH (ref 9–23)
BUN: 20 mg/dL (ref 6–24)
Bilirubin Total: 0.5 mg/dL (ref 0.0–1.2)
CO2: 21 mmol/L (ref 20–29)
Calcium: 9.7 mg/dL (ref 8.7–10.2)
Chloride: 107 mmol/L — ABNORMAL HIGH (ref 96–106)
Creatinine, Ser: 0.71 mg/dL (ref 0.57–1.00)
Globulin, Total: 2.3 g/dL (ref 1.5–4.5)
Glucose: 97 mg/dL (ref 70–99)
Potassium: 4.7 mmol/L (ref 3.5–5.2)
Sodium: 142 mmol/L (ref 134–144)
Total Protein: 6.9 g/dL (ref 6.0–8.5)
eGFR: 102 mL/min/{1.73_m2} (ref >59)

## 2021-11-14 LAB — HEMOGLOBIN A1C
Est. average glucose Bld gHb Est-mCnc: 123 mg/dL
Hgb A1c MFr Bld: 5.9 % — ABNORMAL HIGH (ref 4.8–5.6)

## 2021-11-14 LAB — VITAMIN D 25 HYDROXY (VIT D DEFICIENCY, FRACTURES): Vit D, 25-Hydroxy: 67 ng/mL (ref 30.0–100.0)

## 2021-11-14 LAB — INSULIN, RANDOM: INSULIN: 11.8 u[IU]/mL (ref 2.6–24.9)

## 2021-11-14 MED ORDER — DESVENLAFAXINE SUCCINATE ER 50 MG PO TB24
50.0000 mg | ORAL_TABLET | Freq: Every day | ORAL | 0 refills | Status: DC
  Filled 2021-11-14: qty 90, 90d supply, fill #0

## 2021-11-14 NOTE — Progress Notes (Signed)
Chief Complaint:   OBESITY Melissa [name] is here to discuss her progress with her obesity treatment plan along with follow-up of her obesity related diagnoses. Melissa [name] is on the Category 2 Plan and states she is following her eating plan approximately 50% of the time. Melissa [name] states she is doing 0 minutes 0 times per week.  Today's visit was #: 27 Starting weight: 219 lbs Starting date: 05/18/2019 Today's weight: 181 lbs Today's date: 11/13/2021 Total lbs lost to date: 38 Total lbs lost since last in-office visit: 4  Interim History: Melissa [name] has done well with weight loss even over the holidays. She had less temptations and minimal leftovers. She is stable on Saxenda at 1.8 mg.  Subjective:   1. Pre-diabetes Melissa [name] is stable on metformin, and she is also on a GLP-1. She is doing well with weight loss.  2. Vitamin D deficiency Melissa [name] is stable on Vit D, and she is due for labs.  3. Pure hypercholesterolemia Melissa [name] is working on diet and exercise. She is not on stating and she denies chest pain. She is due to have labs.  4. At risk for hypoglycemia Melissa [name] is at increased risk for hypoglycemia due to skipping meals.   Assessment/Plan:   1. Pre-diabetes We will check labs today, and we will refill metformin for 1 month. Melissa [name] will continue to work on weight loss, exercise, and decreasing simple carbohydrates to help decrease the risk of diabetes.   - metFORMIN (GLUCOPHAGE) 500 MG tablet; Take 1 tablet (500 mg total) by mouth 2 (two) times daily with a meal.  Dispense: 60 tablet; Refill: 0 - CMP14+EGFR - Insulin, random - Hemoglobin A1c  2. Vitamin D deficiency We will check labs today, and we will refill prescription Vitamin D for 1 month. Melissa [name] will follow-up for routine testing of Vitamin D, at least 2-3 times per year to avoid over-replacement.  - Vitamin D, Ergocalciferol, (DRISDOL) 1.25 MG (50000 UNIT) CAPS capsule; Take 1 capsule (50,000 Units total) by mouth once a week.  Dispense: 4  capsule; Refill: 0 - VITAMIN D 25 Hydroxy (Vit-D Deficiency, Fractures)  3. Pure hypercholesterolemia Cardiovascular risk and specific lipid/LDL goals reviewed. We discussed several lifestyle modifications today. We will check labs today. Melissa [name] will continue to work on diet, exercise and weight loss efforts. Orders and follow up as documented in patient record.   - Lipid Panel With LDL/HDL Ratio  4. At risk for hypoglycemia Melissa [name] was given approximately 15 minutes of counseling today regarding prevention of hypoglycemia. She was advised of symptoms of hypoglycemia. Melissa [name] was instructed to avoid skipping meals, eat regular protein rich meals and schedule low calorie snacks as needed.   Repetitive spaced learning was employed today to elicit superior memory formation and behavioral change  5. Obesity with current BMI of 32.8 Melissa [name] is currently in the action stage of change. As such, her goal is to continue with weight loss efforts. She has agreed to the Category 2 Plan.   We discussed various medication options to help Melissa [name] with her weight loss efforts and we both agreed to continue Saxenda, and we will refill for 1 month, and refill nano needles #100, with no refills.  - Liraglutide -Weight Management 18 MG/3ML SOPN; Inject 3 mg into the skin daily.  Dispense: 15 mL; Refill: 0 - Insulin Pen Needle 32G X 4 MM MISC; Use as directed daily  Dispense: 100 each; Refill: 0  Behavioral modification strategies: increasing lean protein intake.  Melissa [name] has agreed to follow-up  with our clinic in 4 weeks. She was informed of the importance of frequent follow-up visits to maximize her success with intensive lifestyle modifications for her multiple health conditions.   Melissa [name] was informed we would discuss her lab results at her next visit unless there is a critical issue that needs to be addressed sooner. Melissa [name] agreed to keep her next visit at the agreed upon time to discuss these results.  Objective:    Blood pressure 106/71, pulse 71, temperature (!) 97.5 F (36.4 C), height _0  (1.6 m), weight 181 lb (82.1 kg), SpO2 100 %. Body mass index is 32.06 kg/m.  General: Cooperative, alert, well developed, in no acute distress. HEENT: Conjunctivae and lids unremarkable. Cardiovascular: Regular rhythm.  Lungs: Normal work of breathing. Neurologic: No focal deficits.   Lab Results  Component Value Date   CREATININE 0.71 11/13/2021   BUN 20 11/13/2021   NA 142 11/13/2021   K 4.7 11/13/2021   CL 107 (H) 11/13/2021   CO2 21 11/13/2021   Lab Results  Component Value Date   ALT 23 11/13/2021   AST 17 11/13/2021   ALKPHOS 67 11/13/2021   BILITOT 0.5 11/13/2021   Lab Results  Component Value Date   HGBA1C 5.9 (H) 11/13/2021   HGBA1C 5.8 (H) 10/18/2020   HGBA1C 5.7 (H) 05/22/2020   HGBA1C 5.7 (H) 02/21/2020   HGBA1C 5.6 10/19/2019   Lab Results  Component Value Date   INSULIN 11.8 11/13/2021   INSULIN 7.1 10/18/2020   INSULIN 15.5 05/22/2020   INSULIN 14.8 02/21/2020   INSULIN 13.3 10/19/2019   Lab Results  Component Value Date   TSH 2.530 05/18/2019   Lab Results  Component Value Date   CHOL 219 (H) 11/13/2021   HDL 49 11/13/2021   LDLCALC 144 (H) 11/13/2021   TRIG 146 11/13/2021   Lab Results  Component Value Date   VD25OH 67.0 11/13/2021   VD25OH 68.5 10/18/2020   VD25OH 56.3 05/22/2020   Lab Results  Component Value Date   WBC 7.8 05/18/2019   HGB 15.6 05/18/2019   HCT 46.5 05/18/2019   MCV 92 05/18/2019   No results found for: IRON, TIBC, FERRITIN  Attestation Statements:   Reviewed by clinician on day of visit: allergies, medications, problem list, medical history, surgical history, family history, social history, and previous encounter notes.   I, Trixie Dredge, am acting as transcriptionist for Dennard Nip, MD.  I have reviewed the above documentation for accuracy and completeness, and I agree with the above. -  Dennard Nip, MD

## 2021-11-26 ENCOUNTER — Other Ambulatory Visit (HOSPITAL_COMMUNITY): Payer: Self-pay

## 2021-11-26 MED ORDER — AMPHETAMINE-DEXTROAMPHET ER 15 MG PO CP24
15.0000 mg | ORAL_CAPSULE | ORAL | 0 refills | Status: DC
  Filled 2021-11-26: qty 30, 30d supply, fill #0

## 2021-12-06 DIAGNOSIS — F3341 Major depressive disorder, recurrent, in partial remission: Secondary | ICD-10-CM | POA: Diagnosis not present

## 2021-12-06 DIAGNOSIS — F900 Attention-deficit hyperactivity disorder, predominantly inattentive type: Secondary | ICD-10-CM | POA: Diagnosis not present

## 2021-12-11 ENCOUNTER — Ambulatory Visit (INDEPENDENT_AMBULATORY_CARE_PROVIDER_SITE_OTHER): Payer: 59 | Admitting: Family Medicine

## 2021-12-20 ENCOUNTER — Other Ambulatory Visit (HOSPITAL_COMMUNITY): Payer: Self-pay

## 2021-12-20 ENCOUNTER — Other Ambulatory Visit (INDEPENDENT_AMBULATORY_CARE_PROVIDER_SITE_OTHER): Payer: Self-pay | Admitting: Family Medicine

## 2021-12-20 DIAGNOSIS — F3289 Other specified depressive episodes: Secondary | ICD-10-CM

## 2021-12-20 DIAGNOSIS — R7303 Prediabetes: Secondary | ICD-10-CM

## 2021-12-20 NOTE — Telephone Encounter (Signed)
LAST APPOINTMENT DATE: 11/13/21 NEXT APPOINTMENT DATE: 01/08/22   [facility] [address] [place] [address] Phone: [phone] Fax: [phone]  Patient is requesting a refill of the following medications: Requested Prescriptions   Pending Prescriptions Disp Refills   topiramate (TOPAMAX) 50 MG tablet 30 tablet 0    Sig: Take 1 tablet (50 mg total) by mouth daily.   metFORMIN (GLUCOPHAGE) 500 MG tablet 60 tablet 0    Sig: Take 1 tablet (500 mg total) by mouth 2 (two) times daily with a meal.    Date last filled: 11/13/21 Previously prescribed by Dr. Leafy Ro  Lab Results  Component Value Date   HGBA1C 5.9 (H) 11/13/2021   HGBA1C 5.8 (H) 10/18/2020   HGBA1C 5.7 (H) 05/22/2020   Lab Results  Component Value Date   LDLCALC 144 (H) 11/13/2021   CREATININE 0.71 11/13/2021   Lab Results  Component Value Date   VD25OH 67.0 11/13/2021   VD25OH 68.5 10/18/2020   VD25OH 56.3 05/22/2020    BP Readings from Last 3 Encounters:  11/13/21 106/71  10/08/21 131/73  08/28/21 126/76

## 2021-12-21 ENCOUNTER — Other Ambulatory Visit (HOSPITAL_COMMUNITY): Payer: Self-pay

## 2021-12-21 ENCOUNTER — Other Ambulatory Visit (INDEPENDENT_AMBULATORY_CARE_PROVIDER_SITE_OTHER): Payer: Self-pay | Admitting: Family Medicine

## 2021-12-21 DIAGNOSIS — F3289 Other specified depressive episodes: Secondary | ICD-10-CM

## 2021-12-21 DIAGNOSIS — R7303 Prediabetes: Secondary | ICD-10-CM

## 2021-12-24 ENCOUNTER — Encounter (INDEPENDENT_AMBULATORY_CARE_PROVIDER_SITE_OTHER): Payer: Self-pay

## 2021-12-24 ENCOUNTER — Other Ambulatory Visit (HOSPITAL_COMMUNITY): Payer: Self-pay

## 2021-12-24 ENCOUNTER — Telehealth (INDEPENDENT_AMBULATORY_CARE_PROVIDER_SITE_OTHER): Payer: Self-pay | Admitting: Family Medicine

## 2021-12-24 NOTE — Telephone Encounter (Signed)
Patient's Husband states that she needs MetFORMIN (GLUCOPHAGE) 500 MG tablet and Topiramate. Patient completely out of both. Their pharmacy has denied refills. Patient would like a call back.

## 2021-12-24 NOTE — Telephone Encounter (Signed)
MyChart message sent to pt to find out if they have enough Topamax to get them through until next appt.

## 2021-12-25 ENCOUNTER — Encounter (INDEPENDENT_AMBULATORY_CARE_PROVIDER_SITE_OTHER): Payer: Self-pay

## 2021-12-25 ENCOUNTER — Other Ambulatory Visit (HOSPITAL_COMMUNITY): Payer: Self-pay

## 2021-12-25 MED ORDER — METFORMIN HCL 500 MG PO TABS
500.0000 mg | ORAL_TABLET | Freq: Two times a day (BID) | ORAL | 0 refills | Status: DC
  Filled 2021-12-25: qty 60, 30d supply, fill #0

## 2021-12-25 MED ORDER — TOPIRAMATE 50 MG PO TABS
50.0000 mg | ORAL_TABLET | Freq: Every day | ORAL | 0 refills | Status: DC
  Filled 2021-12-25: qty 30, 30d supply, fill #0

## 2021-12-25 NOTE — Telephone Encounter (Signed)
Pt sent a message-CAS

## 2021-12-25 NOTE — Telephone Encounter (Signed)
See prev note on this pt  LAST APPOINTMENT DATE: 11/06/21 NEXT APPOINTMENT DATE: 01/08/22   [facility] [address] [place] [address] Phone: [phone] Fax: [phone]  Patient is requesting a refill of the following medications: Pending Prescriptions:                       Disp   Refills   metFORMIN (GLUCOPHAGE) 500 MG tablet       60 tab*0       Sig: Take 1 tablet (500 mg total) by mouth 2 (two) times          daily with a meal.   topiramate (TOPAMAX) 50 MG tablet          30 tab*0       Sig: Take 1 tablet (50 mg total) by mouth daily.   Date last filled: 11/13/21 Previously prescribed by Dr Leafy Ro  Lab Results      Component                Value               Date                      HGBA1C                   5.9 (H)             11/13/2021                HGBA1C                   5.8 (H)             10/18/2020                HGBA1C                   5.7 (H)             05/22/2020           Lab Results      Component                Value               Date                      LDLCALC                  144 (H)             11/13/2021                CREATININE               0.71                11/13/2021           Lab Results      Component                Value               Date                      VD25OH                   67.0  11/13/2021                VD25OH                   68.5                10/18/2020                VD25OH                   56.3                05/22/2020            BP Readings from Last 3 Encounters: 11/13/21 : 106/71 10/08/21 : 131/73 08/28/21 : 126/76

## 2021-12-25 NOTE — Telephone Encounter (Signed)
Spoke w/ pt husband, aware that we will refill at next visit, offered earlier time, but needs to keep current appt-CAS

## 2021-12-25 NOTE — Telephone Encounter (Signed)
She had labs done at last OV on 11/13/21 that need to be reviewed with her and she was told to f/up in 4 wks.   ONLY give 30 d supply and pt needs a f/up appt asap

## 2021-12-25 NOTE — Telephone Encounter (Signed)
Would you like to refill topiramate prescribed for other depression?   LAST APPOINTMENT DATE: 11/06/21 NEXT APPOINTMENT DATE: 01/08/22   [facility] [address] [place] [address] Phone: [phone] Fax: [phone]  Patient is requesting a refill of the following medications: No prescriptions requested or ordered in this encounter   Date last filled: 11/13/21 Previously prescribed by Dr Leafy Ro  Lab Results      Component                Value               Date                      HGBA1C                   5.9 (H)             11/13/2021                HGBA1C                   5.8 (H)             10/18/2020                HGBA1C                   5.7 (H)             05/22/2020           Lab Results      Component                Value               Date                      LDLCALC                  144 (H)             11/13/2021                CREATININE               0.71                11/13/2021           Lab Results      Component                Value               Date                      VD25OH                   67.0                11/13/2021                VD25OH                   68.5                10/18/2020                VD25OH  56.3                05/22/2020            BP Readings from Last 3 Encounters: 11/13/21 : 106/71 10/08/21 : 131/73 08/28/21 : 126/76

## 2021-12-31 ENCOUNTER — Other Ambulatory Visit (HOSPITAL_COMMUNITY): Payer: Self-pay

## 2021-12-31 MED ORDER — AMPHETAMINE-DEXTROAMPHET ER 15 MG PO CP24
15.0000 mg | ORAL_CAPSULE | Freq: Every morning | ORAL | 0 refills | Status: DC
  Filled 2021-12-31: qty 30, 30d supply, fill #0

## 2022-01-08 ENCOUNTER — Ambulatory Visit (INDEPENDENT_AMBULATORY_CARE_PROVIDER_SITE_OTHER): Payer: 59 | Admitting: Family Medicine

## 2022-01-16 ENCOUNTER — Other Ambulatory Visit (INDEPENDENT_AMBULATORY_CARE_PROVIDER_SITE_OTHER): Payer: Self-pay

## 2022-01-16 ENCOUNTER — Other Ambulatory Visit (HOSPITAL_COMMUNITY): Payer: Self-pay

## 2022-01-16 DIAGNOSIS — R7303 Prediabetes: Secondary | ICD-10-CM

## 2022-01-16 DIAGNOSIS — F3289 Other specified depressive episodes: Secondary | ICD-10-CM

## 2022-01-16 DIAGNOSIS — E559 Vitamin D deficiency, unspecified: Secondary | ICD-10-CM

## 2022-01-16 DIAGNOSIS — E66812 Obesity, class 2: Secondary | ICD-10-CM

## 2022-01-16 MED ORDER — TOPIRAMATE 50 MG PO TABS
50.0000 mg | ORAL_TABLET | Freq: Every day | ORAL | 0 refills | Status: DC
  Filled 2022-01-16: qty 30, 30d supply, fill #0

## 2022-01-16 MED ORDER — LIRAGLUTIDE -WEIGHT MANAGEMENT 18 MG/3ML ~~LOC~~ SOPN
3.0000 mg | PEN_INJECTOR | Freq: Every day | SUBCUTANEOUS | 0 refills | Status: AC
  Filled 2022-01-16: qty 15, 30d supply, fill #0

## 2022-01-16 MED ORDER — METFORMIN HCL 500 MG PO TABS
500.0000 mg | ORAL_TABLET | Freq: Two times a day (BID) | ORAL | 0 refills | Status: DC
  Filled 2022-01-16: qty 60, 30d supply, fill #0

## 2022-01-16 MED ORDER — INSULIN PEN NEEDLE 32G X 4 MM MISC
0 refills | Status: AC
  Filled 2022-01-16: qty 100, 90d supply, fill #0

## 2022-01-16 MED ORDER — VITAMIN D (ERGOCALCIFEROL) 1.25 MG (50000 UNIT) PO CAPS
50000.0000 [IU] | ORAL_CAPSULE | ORAL | 0 refills | Status: AC
  Filled 2022-01-16: qty 4, 28d supply, fill #0

## 2022-01-17 ENCOUNTER — Other Ambulatory Visit (HOSPITAL_COMMUNITY): Payer: Self-pay

## 2022-02-06 ENCOUNTER — Other Ambulatory Visit (HOSPITAL_COMMUNITY): Payer: Self-pay

## 2022-02-06 MED ORDER — DESVENLAFAXINE SUCCINATE ER 50 MG PO TB24
50.0000 mg | ORAL_TABLET | Freq: Every day | ORAL | 0 refills | Status: DC
  Filled 2022-02-06: qty 90, 90d supply, fill #0

## 2022-02-06 MED ORDER — AMPHETAMINE-DEXTROAMPHET ER 15 MG PO CP24
15.0000 mg | ORAL_CAPSULE | Freq: Every morning | ORAL | 0 refills | Status: AC
Start: 2022-02-06 — End: ?
  Filled 2022-02-06: qty 30, 30d supply, fill #0

## 2022-02-11 ENCOUNTER — Other Ambulatory Visit (HOSPITAL_COMMUNITY): Payer: Self-pay

## 2022-02-28 ENCOUNTER — Other Ambulatory Visit (HOSPITAL_COMMUNITY): Payer: Self-pay

## 2022-02-28 MED ORDER — AMPHETAMINE-DEXTROAMPHET ER 15 MG PO CP24
15.0000 mg | ORAL_CAPSULE | Freq: Every morning | ORAL | 0 refills | Status: DC
  Filled 2022-03-22: qty 30, 30d supply, fill #0

## 2022-03-22 ENCOUNTER — Other Ambulatory Visit (HOSPITAL_COMMUNITY): Payer: Self-pay

## 2022-03-26 DIAGNOSIS — F900 Attention-deficit hyperactivity disorder, predominantly inattentive type: Secondary | ICD-10-CM | POA: Diagnosis not present

## 2022-03-26 DIAGNOSIS — F3341 Major depressive disorder, recurrent, in partial remission: Secondary | ICD-10-CM | POA: Diagnosis not present

## 2022-04-03 ENCOUNTER — Other Ambulatory Visit (HOSPITAL_COMMUNITY): Payer: Self-pay

## 2022-04-03 ENCOUNTER — Telehealth (INDEPENDENT_AMBULATORY_CARE_PROVIDER_SITE_OTHER): Payer: 59 | Admitting: Family Medicine

## 2022-04-03 ENCOUNTER — Encounter (INDEPENDENT_AMBULATORY_CARE_PROVIDER_SITE_OTHER): Payer: Self-pay | Admitting: Family Medicine

## 2022-04-03 DIAGNOSIS — F3289 Other specified depressive episodes: Secondary | ICD-10-CM | POA: Diagnosis not present

## 2022-04-03 DIAGNOSIS — R7303 Prediabetes: Secondary | ICD-10-CM

## 2022-04-03 DIAGNOSIS — Z7984 Long term (current) use of oral hypoglycemic drugs: Secondary | ICD-10-CM

## 2022-04-03 DIAGNOSIS — E668 Other obesity: Secondary | ICD-10-CM

## 2022-04-03 DIAGNOSIS — Z6838 Body mass index (BMI) 38.0-38.9, adult: Secondary | ICD-10-CM

## 2022-04-03 MED ORDER — METFORMIN HCL 500 MG PO TABS
500.0000 mg | ORAL_TABLET | Freq: Two times a day (BID) | ORAL | 0 refills | Status: AC
  Filled 2022-04-03: qty 60, 30d supply, fill #0

## 2022-04-03 MED ORDER — TOPIRAMATE 50 MG PO TABS
50.0000 mg | ORAL_TABLET | Freq: Every day | ORAL | 0 refills | Status: AC
  Filled 2022-04-03: qty 30, 30d supply, fill #0

## 2022-04-10 NOTE — Progress Notes (Signed)
TeleHealth Visit:  Due to the COVID-19 pandemic, this visit was completed with telemedicine (audio/video) technology to reduce patient and provider exposure as well as to preserve personal protective equipment.   Melissa [name] has verbally consented to this TeleHealth visit. The patient is located at home, the provider is located at the [facility] office. The participants in this visit include the listed provider and patient. The visit was conducted today via MyChart video.   Chief Complaint: OBESITY Melissa [name] is here to discuss her progress with her obesity treatment plan along with follow-up of her obesity related diagnoses. Melissa [name] is on the Category 2 Plan and states she is following her eating plan approximately 0% of the time. Melissa [name] states she is walking for 20 minutes 3 times per week.  Today's visit was #: 31 Starting weight: 219 lbs Starting date: 05/18/2019  Interim History: Sherrine has been on vacation and she did some celebration eating. She is back home and she is ready to work on meal planning.   Subjective:   1. Pre-diabetes Melissa [name] has been off her eating plan, and simple carbohydrates have increased. She is working on getting back on track.   2. Other depression with emotional eating Melissa [name] is stable on Topamax, no side effects were noted. She is ready to work on decreasing emotional eating behaviors again.  Assessment/Plan:   1. Pre-diabetes We will refill metformin for 1 month. Teresha will continue to work on weight loss, exercise, and decreasing simple carbohydrates to help decrease the risk of diabetes.   - metFORMIN (GLUCOPHAGE) 500 MG tablet; Take 1 tablet (500 mg total) by mouth 2 (two) times daily with a meal. No refill until office visit  Dispense: 60 tablet; Refill: 0  2. Other depression with emotional eating We will refill Topamax for 1 month. Behavior modification techniques were discussed today to help Melissa [name] deal with her emotional/non-hunger eating  behaviors.  Orders and follow up as documented in patient record.   - topiramate (TOPAMAX) 50 MG tablet; Take 1 tablet (50 mg total) by mouth daily. Needs office visit for more refills.  Dispense: 30 tablet; Refill: 0  3. Class 2 severe obesity with serious comorbidity and body mass index (BMI) of 38.0 to 38.9 in adult, unspecified obesity type (HCC) Melissa [name] is currently in the action stage of change. As such, her goal is to continue with weight loss efforts. She has agreed to the Category 2 Plan or following a lower carbohydrate, vegetable and lean protein rich diet plan.   We discussed various medication options to help Melissa [name] with her weight loss efforts and we both agreed to restart Saxenda at 1.2 mg, no refill needed.  Exercise goals: As is.  Behavioral modification strategies: increasing lean protein intake and decreasing simple carbohydrates.  Melissa [name] has agreed to follow-up with our clinic in 4 weeks. She was informed of the importance of frequent follow-up visits to maximize her success with intensive lifestyle modifications for her multiple health conditions.  Objective:   VITALS: Per patient if applicable, see vitals. GENERAL: Alert and in no acute distress. CARDIOPULMONARY: No increased WOB. Speaking in clear sentences.  PSYCH: Pleasant and cooperative. Speech normal rate and rhythm. Affect is appropriate. Insight and judgement are appropriate. Attention is focused, linear, and appropriate.  NEURO: Oriented as arrived to appointment on time with no prompting.   Lab Results  Component Value Date   CREATININE 0.71 11/13/2021   BUN 20 11/13/2021   NA 142 11/13/2021   K 4.7  11/13/2021   CL 107 (H) 11/13/2021   CO2 21 11/13/2021   Lab Results  Component Value Date   ALT 23 11/13/2021   AST 17 11/13/2021   ALKPHOS 67 11/13/2021   BILITOT 0.5 11/13/2021   Lab Results  Component Value Date   HGBA1C 5.9 (H) 11/13/2021   HGBA1C 5.8 (H) 10/18/2020   HGBA1C 5.7 (H) 05/22/2020    HGBA1C 5.7 (H) 02/21/2020   HGBA1C 5.6 10/19/2019   Lab Results  Component Value Date   INSULIN 11.8 11/13/2021   INSULIN 7.1 10/18/2020   INSULIN 15.5 05/22/2020   INSULIN 14.8 02/21/2020   INSULIN 13.3 10/19/2019   Lab Results  Component Value Date   TSH 2.530 05/18/2019   Lab Results  Component Value Date   CHOL 219 (H) 11/13/2021   HDL 49 11/13/2021   LDLCALC 144 (H) 11/13/2021   TRIG 146 11/13/2021   Lab Results  Component Value Date   VD25OH 67.0 11/13/2021   VD25OH 68.5 10/18/2020   VD25OH 56.3 05/22/2020   Lab Results  Component Value Date   WBC 7.8 05/18/2019   HGB 15.6 05/18/2019   HCT 46.5 05/18/2019   MCV 92 05/18/2019   No results found for: IRON, TIBC, FERRITIN  Attestation Statements:   Reviewed by clinician on day of visit: allergies, medications, problem list, medical history, surgical history, family history, social history, and previous encounter notes.   I, Trixie Dredge, am acting as transcriptionist for Dennard Nip, MD.  I have reviewed the above documentation for accuracy and completeness, and I agree with the above. - Dennard Nip, MD

## 2022-04-23 ENCOUNTER — Other Ambulatory Visit (HOSPITAL_COMMUNITY): Payer: Self-pay

## 2022-04-23 MED ORDER — AMPHETAMINE-DEXTROAMPHET ER 15 MG PO CP24
15.0000 mg | ORAL_CAPSULE | Freq: Every morning | ORAL | 0 refills | Status: DC
Start: 2022-04-23 — End: 2022-05-27
  Filled 2022-04-23: qty 30, 30d supply, fill #0

## 2022-05-02 ENCOUNTER — Other Ambulatory Visit (HOSPITAL_COMMUNITY): Payer: Self-pay

## 2022-05-02 MED ORDER — DESVENLAFAXINE SUCCINATE ER 50 MG PO TB24
50.0000 mg | ORAL_TABLET | Freq: Every day | ORAL | 0 refills | Status: DC
  Filled 2022-05-02: qty 90, 90d supply, fill #0

## 2022-05-09 ENCOUNTER — Other Ambulatory Visit (HOSPITAL_COMMUNITY): Payer: Self-pay

## 2022-05-10 ENCOUNTER — Other Ambulatory Visit (HOSPITAL_COMMUNITY): Payer: Self-pay

## 2022-05-27 ENCOUNTER — Other Ambulatory Visit (HOSPITAL_COMMUNITY): Payer: Self-pay

## 2022-05-27 MED ORDER — AMPHETAMINE-DEXTROAMPHET ER 15 MG PO CP24
15.0000 mg | ORAL_CAPSULE | Freq: Every morning | ORAL | 0 refills | Status: DC
  Filled 2022-05-27: qty 30, 30d supply, fill #0

## 2022-05-30 ENCOUNTER — Other Ambulatory Visit (HOSPITAL_COMMUNITY): Payer: Self-pay

## 2022-06-12 ENCOUNTER — Encounter (INDEPENDENT_AMBULATORY_CARE_PROVIDER_SITE_OTHER): Payer: Self-pay

## 2022-06-21 ENCOUNTER — Other Ambulatory Visit (HOSPITAL_COMMUNITY): Payer: Self-pay

## 2022-06-24 ENCOUNTER — Other Ambulatory Visit (HOSPITAL_COMMUNITY): Payer: Self-pay

## 2022-06-26 ENCOUNTER — Other Ambulatory Visit (HOSPITAL_COMMUNITY): Payer: Self-pay

## 2022-06-26 MED ORDER — DESVENLAFAXINE SUCCINATE ER 50 MG PO TB24
ORAL_TABLET | ORAL | 0 refills | Status: DC
  Filled 2022-06-26: qty 90, 90d supply, fill #0

## 2022-06-26 MED ORDER — AMPHETAMINE-DEXTROAMPHET ER 15 MG PO CP24
15.0000 mg | ORAL_CAPSULE | ORAL | 0 refills | Status: AC
  Filled 2022-06-26 – 2022-09-21 (×4): qty 30, 30d supply, fill #0

## 2022-06-27 ENCOUNTER — Other Ambulatory Visit (HOSPITAL_COMMUNITY): Payer: Self-pay

## 2022-08-15 ENCOUNTER — Other Ambulatory Visit (HOSPITAL_COMMUNITY): Payer: Self-pay

## 2022-08-21 ENCOUNTER — Other Ambulatory Visit (HOSPITAL_COMMUNITY): Payer: Self-pay

## 2022-08-21 MED ORDER — AMPHETAMINE-DEXTROAMPHET ER 15 MG PO CP24
15.0000 mg | ORAL_CAPSULE | Freq: Every morning | ORAL | 0 refills | Status: AC
  Filled 2022-08-21: qty 30, 30d supply, fill #0

## 2022-08-22 ENCOUNTER — Other Ambulatory Visit (HOSPITAL_COMMUNITY): Payer: Self-pay

## 2022-09-02 ENCOUNTER — Other Ambulatory Visit (HOSPITAL_COMMUNITY): Payer: Self-pay

## 2022-09-12 ENCOUNTER — Other Ambulatory Visit (HOSPITAL_COMMUNITY): Payer: Self-pay

## 2022-09-12 MED ORDER — DESVENLAFAXINE SUCCINATE ER 50 MG PO TB24
50.0000 mg | ORAL_TABLET | Freq: Every day | ORAL | 0 refills | Status: AC
  Filled 2022-09-12: qty 90, 90d supply, fill #0

## 2022-09-21 ENCOUNTER — Other Ambulatory Visit (HOSPITAL_COMMUNITY): Payer: Self-pay
